# Patient Record
Sex: Female | Born: 1995 | Race: White | Hispanic: No | Marital: Single | Smoking: Never smoker
Health system: Southern US, Community
[De-identification: ages and names within clinical notes are randomized; demographics above are authoritative.]

## PROBLEM LIST (undated history)

## (undated) DIAGNOSIS — F419 Anxiety disorder, unspecified: Secondary | ICD-10-CM

## (undated) DIAGNOSIS — F509 Eating disorder, unspecified: Secondary | ICD-10-CM

## (undated) DIAGNOSIS — E559 Vitamin D deficiency, unspecified: Secondary | ICD-10-CM

## (undated) HISTORY — DX: Anxiety disorder, unspecified: F41.9

## (undated) HISTORY — DX: Eating disorder, unspecified: F50.9

## (undated) HISTORY — DX: Vitamin D deficiency, unspecified: E55.9

---

## 2019-10-13 ENCOUNTER — Encounter: Payer: Self-pay | Admitting: Pediatrics

## 2019-10-13 ENCOUNTER — Other Ambulatory Visit: Payer: Self-pay

## 2019-10-13 ENCOUNTER — Ambulatory Visit (INDEPENDENT_AMBULATORY_CARE_PROVIDER_SITE_OTHER): Payer: 59 | Admitting: Pediatrics

## 2019-10-13 ENCOUNTER — Other Ambulatory Visit (HOSPITAL_COMMUNITY)
Admission: RE | Admit: 2019-10-13 | Discharge: 2019-10-13 | Disposition: A | Discharge status: Home / Self Care | Payer: 59 | Source: Ambulatory Visit | Attending: Pediatrics | Admitting: Pediatrics

## 2019-10-13 VITALS — BP 120/86 | HR 88 | Ht 62.6 in | Wt 181.4 lb

## 2019-10-13 DIAGNOSIS — G8929 Other chronic pain: Secondary | ICD-10-CM

## 2019-10-13 DIAGNOSIS — Z113 Encounter for screening for infections with a predominantly sexual mode of transmission: Secondary | ICD-10-CM

## 2019-10-13 DIAGNOSIS — G479 Sleep disorder, unspecified: Secondary | ICD-10-CM

## 2019-10-13 DIAGNOSIS — Z1389 Encounter for screening for other disorder: Secondary | ICD-10-CM

## 2019-10-13 DIAGNOSIS — Z23 Encounter for immunization: Secondary | ICD-10-CM

## 2019-10-13 DIAGNOSIS — R519 Headache, unspecified: Secondary | ICD-10-CM

## 2019-10-13 DIAGNOSIS — K5901 Slow transit constipation: Secondary | ICD-10-CM | POA: Diagnosis not present

## 2019-10-13 DIAGNOSIS — F4323 Adjustment disorder with mixed anxiety and depressed mood: Secondary | ICD-10-CM | POA: Diagnosis not present

## 2019-10-13 DIAGNOSIS — F5002 Anorexia nervosa, binge eating/purging type: Secondary | ICD-10-CM

## 2019-10-13 DIAGNOSIS — Z3202 Encounter for pregnancy test, result negative: Secondary | ICD-10-CM

## 2019-10-13 DIAGNOSIS — F902 Attention-deficit hyperactivity disorder, combined type: Secondary | ICD-10-CM

## 2019-10-13 LAB — POCT URINE PREGNANCY: Preg Test, Ur: NEGATIVE

## 2019-10-13 LAB — POCT URINALYSIS DIPSTICK
Bilirubin, UA: NEGATIVE
Blood, UA: NEGATIVE
Glucose, UA: NEGATIVE
Ketones, UA: NEGATIVE
Leukocytes, UA: NEGATIVE
Nitrite, UA: NEGATIVE
Protein, UA: NEGATIVE
Spec Grav, UA: 1.02 (ref 1.010–1.025)
Urobilinogen, UA: NEGATIVE E.U./dL — AB
pH, UA: 5 (ref 5.0–8.0)

## 2019-10-13 MED ORDER — OLANZAPINE 5 MG PO TABS: 5.0000 mg | ORAL_TABLET | Freq: Every day | ORAL | 3 refills | Status: DC

## 2019-10-13 NOTE — Progress Notes (Signed)
THIS RECORD MAY CONTAIN CONFIDENTIAL INFORMATION THAT SHOULD NOT BE RELEASED WITHOUT REVIEW OF THE SERVICE PROVIDER.  Adolescent Medicine Consultation Initial Visit Keili Sferra  is a 24 y.o. female referred by No ref. provider found here today for evaluation of disordered eating.      Growth Chart Viewed? not applicable  Previsit planning completed:  not applicable   History was provided by the patient.  PCP Confirmed?  Does not have one  My Chart Activated?   yes    HPI:    24 yo who was referred by Vassie Moselle for disordered eating  24 hour food recall: Yesterday ate 1 piece bread, 1.5 cups strawberries, 3 pieces [place] Has not eaten anything today Drinks water, unsure of the amount, maybe 2 bottles per day Not interested in doing ensure or supplements, open to electrolyte replacement packs  Has had vomiting, self induced. Occurs with every "meal" First started when she was 16, went back to purging this past September/October, feels like it has gotten slightly better with time. Used to make herself drink water so she could throw up again. Eating disorder voice is loud and constant, has not been treated or tested for ED before, except for talk therapy  Exercise: tries to do step goals (10,000-11,000 steps), hula hoops for 1.5-2 hours, used to do it everyday but stopped since she's been busy  Mood: depends, "OK I guess" Has hx of anxiety attacks.  Therapy: goes once a week, likes her therapist. Rozell Searing it might be helping, has been going since October.  Last SI and self harm was 2017, cutting. Used box cutters. Denies SI and HI now Eating disorder voice is loud and constant  Sleep: trouble falling asleep, wakes up a lot, gets about 4-5 hours of sleep. Feels tired throughout the day. Has tried melatonin which does not help, previously tried hydroxyzine, trazodone. Got headache with trazodone  Menstrual hx, used to be regular but skipped a cycle last month  Motivation for change:  Unsure if she would like to see a dietician at this time, feels like there's a lot happening. Unsure if able to come back in a week since she is busy with school. Thinks she has been eating more  PMH hx: Has hx of ADHD, on vyvanse now and takes it during the week, takes a break on sundays. Does not want to stop taking it since has difficulty concentrating without it. Also has propanolol PRN for anxiety, prescribed by [facility] in [place], [place].  Mood medication hx: Hydroxyzine- did not help with sleep Trazodone - got headaches Remeron - sounds familiar, thinks she might have tried, did not work Zoloft and prozac - made her anxious Has not tried olanzapine, paxil, or effexor  Health maintenance: does not have a PCP. Goes to dentist and had broken tooth repaired within the last several weeks. Does not think she is up to date with tetanus shot. Going back home for the summer, no health providers there and then will be back in [place] in the fall for schoool  ROS: Has had blood in her vomit a few months ago (November 2020), has had dizziness, tingling, has had LOC a handful of times (last passed out in February 2021), shaking sometimes. Has had chest pain, palpitations a few weeks ago, occurs with falling asleep or when she stands up too fast after vomiting, has constipation, takes laxatives with little improvement, can't remember the names. No stomach pain or diarrhea Gets headaches, almost everyday. Used to take  advil, ibuprofen, alleve, no tylenol. Stopped using them since they stopped working.    No LMP recorded.   Allergies  Allergen Reactions  . Mango Flavor   . Red Dye   . Shellfish Allergy    Outpatient Medications Prior to Visit  Medication Sig Dispense Refill  . lisdexamfetamine (VYVANSE) 50 MG capsule Take 50 mg by mouth daily.    . propranolol (INDERAL) 20 MG tablet Take 20 mg by mouth as needed.     No facility-administered medications prior to visit.      There are no problems to display for this patient.   Past Medical History:  Reviewed and updated?  yes No hospitalizations No surgeries Takes vyvanse and propanolol PRN for anxiety - prescribed by [facility], [place], [place]  Family History: Reviewed and updated? 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  Patient's personal or confidential phone number: [phone]  Enter confidential phone number in Family Comments section of [employer] Tobacco?  no Drugs/ETOH?  Yes, uses marijuana, last used last week. Does not use often since it makes her anxoius Identify as female Sexually active, with female/female Last active yesterday, current partner is female Uses condoms with males Not on any birth control, not interested at this time  Partner preference?  both Sexually Active?  yes  Pregnancy Prevention:  N/A, reviewed condoms & plan B Trauma currently or in the pastt?  No/yes - did not want to elaborate Suicidal or Self-Harm thoughts?   No, not currently Guns in the home?  no  The following portions of the patient's history were reviewed and updated as appropriate: allergies, current medications, past family history, past medical history, past social history, past surgical history and problem list.  Physical Exam:  Vitals:   10/13/19 1109 10/13/19 1121  BP: 128/88 120/86  Pulse: 71 88  Weight: 181 lb 6.4 oz (82.3 kg)   Height: 5' 2.6" (1.59 m)    BP 120/86   Pulse 88   Ht 5' 2.6" (1.59 m)   Wt 181 lb 6.4 oz (82.3 kg)   BMI 32.55 kg/m  Body mass index: body mass index is 32.55 kg/m. Growth percentile SmartLinks can only be used for patients less than 77 years old.  Physical  Exam  Gen: Well developed, well nourished, no acute distress, pleasant and interactive HENT: atraumatic, sclera clear, no eye drainage, MMM, fillings in teeth CV: RRR, no murmurs Chest: CTAB, no increased WOB Abd: soft, NTND Skin; no notable rashes Neuro: awake, alert, moves all extremities   Assessment/Plan:  1. Anorexia nervosa, binge eating/purging type - blood pressure stable today, no signs of orthostatic hypotension. Purging almost everyday with meals and hx of LOC and palpitations is concerning for electrolyte abnormality and risk for refeeding syndrome if begins eating more. Will draw labs today, follow up in one week. - encouraged to do electrolyte packets in the mean time, will need closer follow up of labs if more motivated for change to do ensure or see dietician - encouraged to stop vyvanse before obtaining EKG given hx of palpitations and she was reluctant due to difficulty with concentrating, counseled to obtain EKG ASAP and provided with number to schedule - open to starting olanzapine to help with  eating disorder voice and sleep  Orders: - Amylase - CBC With Differential - Comprehensive metabolic panel - EKG [id number] - Ferritin - IgA - Lipase - Magnesium - Phosphorus - Sedimentation rate - Thyroid Panel With TSH - Tissue transglutaminase, IgA - VITAMIN D 25 Hydroxy (Vit-D Deficiency, Fractures) - B12 - OLANZapine (ZYPREXA) 5 MG tablet; Take 1 tablet (5 mg total) by mouth at bedtime.  Dispense: 30 tablet; Refill: 3 - Hemoglobin A1c - Lipid panel  2. Slow transit constipation - likely 2/2 to eating disorder, consider miralax if able to take volume - avoid excessive laxatives  3. Adjustment disorder with mixed anxiety and depressed mood - will [facility] to get [facility], she is unsure of exact meds but thinks she might have tried zoloft, prozac, remeron, trazodone, and hydroxyzine with little benefit - will try olanzapine now - consider gene  panel  4. Sleep disturbance - discussed taking melatonin at same time everyday and developing a sleep routine, sleep hygeine - OLANZapine (ZYPREXA) 5 MG tablet; Take 1 tablet (5 mg total) by mouth at bedtime.  Dispense: 30 tablet; Refill: 3  5. Chronic nonintractable headache, unspecified headache type - follow up at next visit, likely worsened by ED. Counsel about rebound headaches  6. Routine screening for STI (sexually transmitted infection) - Urine cytology ancillary only - HIV antibody (with reflex)  7. Screening for genitourinary condition - POCT urinalysis dipstick  8. Pregnancy examination or test, negative result - POCT urine pregnancy  9. Health maintenance - would benefit from establishing with PCP   Follow-up:   In 1 week  Medical decision-making:  > 60 minutes spent, more than 50% of appointment was spent discussing diagnosis and management of symptoms   Marney Doctor, MD [facility] PGY3

## 2019-10-13 NOTE — Patient Instructions (Addendum)
  Call EKG [phone]- please get this ASAP  We get labs today and call you with results  Start olanzapine 5 mg at bedtime

## 2019-10-14 ENCOUNTER — Other Ambulatory Visit: Payer: Self-pay | Admitting: Pediatrics

## 2019-10-14 DIAGNOSIS — G479 Sleep disorder, unspecified: Secondary | ICD-10-CM

## 2019-10-14 DIAGNOSIS — F5002 Anorexia nervosa, binge eating/purging type: Secondary | ICD-10-CM

## 2019-10-14 DIAGNOSIS — R790 Abnormal level of blood mineral: Secondary | ICD-10-CM

## 2019-10-14 LAB — VITAMIN B12: Vitamin B-12: 295 pg/mL (ref 200–1100)

## 2019-10-14 LAB — CBC WITH DIFFERENTIAL/PLATELET
Absolute Monocytes: 533 cells/uL (ref 200–950)
Basophils Absolute: 30 cells/uL (ref 0–200)
Basophils Relative: 0.4 %
Eosinophils Absolute: 53 cells/uL (ref 15–500)
Eosinophils Relative: 0.7 %
HCT: 41.1 % (ref 35.0–45.0)
Hemoglobin: 13.6 g/dL (ref 11.7–15.5)
Lymphs Abs: 1568 cells/uL (ref 850–3900)
MCH: 29.8 pg (ref 27.0–33.0)
MCHC: 33.1 g/dL (ref 32.0–36.0)
MCV: 89.9 fL (ref 80.0–100.0)
MPV: 12 fL (ref 7.5–12.5)
Monocytes Relative: 7.1 %
Neutro Abs: 5318 cells/uL (ref 1500–7800)
Neutrophils Relative %: 70.9 %
Platelets: 276 10*3/uL (ref 140–400)
RBC: 4.57 10*6/uL (ref 3.80–5.10)
RDW: 13.2 % (ref 11.0–15.0)
Total Lymphocyte: 20.9 %
WBC: 7.5 10*3/uL (ref 3.8–10.8)

## 2019-10-14 LAB — COMPREHENSIVE METABOLIC PANEL
AG Ratio: 1.7 (calc) (ref 1.0–2.5)
ALT: 7 U/L (ref 6–29)
AST: 10 U/L (ref 10–30)
Albumin: 4.3 g/dL (ref 3.6–5.1)
Alkaline phosphatase (APISO): 26 U/L — ABNORMAL LOW (ref 31–125)
BUN/Creatinine Ratio: 9 (calc) (ref 6–22)
BUN: 6 mg/dL — ABNORMAL LOW (ref 7–25)
CO2: 24 mmol/L (ref 20–32)
Calcium: 9.5 mg/dL (ref 8.6–10.2)
Chloride: 105 mmol/L (ref 98–110)
Creat: 0.64 mg/dL (ref 0.50–1.10)
Globulin: 2.5 g/dL (calc) (ref 1.9–3.7)
Glucose, Bld: 91 mg/dL (ref 65–99)
Potassium: 4.1 mmol/L (ref 3.5–5.3)
Sodium: 140 mmol/L (ref 135–146)
Total Bilirubin: 0.5 mg/dL (ref 0.2–1.2)
Total Protein: 6.8 g/dL (ref 6.1–8.1)

## 2019-10-14 LAB — LIPID PANEL
Cholesterol: 152 mg/dL (ref <200)
HDL: 48 mg/dL — ABNORMAL LOW (ref >=50)
LDL Cholesterol (Calc): 88 mg/dL (calc)
Non-HDL Cholesterol (Calc): 104 mg/dL (calc) (ref <130)
Total CHOL/HDL Ratio: 3.2 (calc) (ref <5.0)
Triglycerides: 73 mg/dL (ref <150)

## 2019-10-14 LAB — THYROID PANEL WITH TSH
Free Thyroxine Index: 3.1 (ref 1.4–3.8)
T3 Uptake: 32 % (ref 22–35)
T4, Total: 9.8 ug/dL (ref 5.1–11.9)
TSH: 0.89 mIU/L

## 2019-10-14 LAB — HIV ANTIBODY (ROUTINE TESTING W REFLEX): HIV 1&2 Ab, 4th Generation: NONREACTIVE

## 2019-10-14 LAB — HEMOGLOBIN A1C
Hgb A1c MFr Bld: 4.9 % of total Hgb (ref <5.7)
Mean Plasma Glucose: 94 (calc)
eAG (mmol/L): 5.2 (calc)

## 2019-10-14 LAB — VITAMIN D 25 HYDROXY (VIT D DEFICIENCY, FRACTURES): Vit D, 25-Hydroxy: 24 ng/mL — ABNORMAL LOW (ref 30–100)

## 2019-10-14 LAB — AMYLASE: Amylase: 25 U/L (ref 21–101)

## 2019-10-14 LAB — URINE CYTOLOGY ANCILLARY ONLY
Chlamydia: NEGATIVE
Comment: NEGATIVE
Comment: NORMAL
Neisseria Gonorrhea: NEGATIVE

## 2019-10-14 LAB — PHOSPHORUS: Phosphorus: 4.2 mg/dL (ref 2.5–4.5)

## 2019-10-14 LAB — IGA: Immunoglobulin A: 274 mg/dL (ref 47–310)

## 2019-10-14 LAB — MAGNESIUM: Magnesium: 1.8 mg/dL (ref 1.5–2.5)

## 2019-10-14 LAB — LIPASE: Lipase: 17 U/L (ref 7–60)

## 2019-10-14 LAB — SEDIMENTATION RATE: Sed Rate: 2 mm/h (ref 0–20)

## 2019-10-14 LAB — TISSUE TRANSGLUTAMINASE, IGA: (tTG) Ab, IgA: 1 U/mL

## 2019-10-14 LAB — FERRITIN: Ferritin: 12 ng/mL — ABNORMAL LOW (ref 16–154)

## 2019-10-14 MED ORDER — OLANZAPINE 5 MG PO TABS: 5.0000 mg | ORAL_TABLET | Freq: Every day | ORAL | 3 refills | Status: DC

## 2019-10-14 MED ORDER — FERROUS FUMARATE 324 (106 FE) MG PO TABS: 1.0000 | ORAL_TABLET | Freq: Every day | ORAL | 3 refills | Status: DC

## 2019-10-15 ENCOUNTER — Other Ambulatory Visit: Payer: Self-pay

## 2019-10-15 ENCOUNTER — Ambulatory Visit (HOSPITAL_COMMUNITY)
Admission: RE | Admit: 2019-10-15 | Discharge: 2019-10-15 | Disposition: A | Discharge status: Home / Self Care | Payer: 59 | Source: Ambulatory Visit | Attending: Pediatrics | Admitting: Pediatrics

## 2019-10-15 DIAGNOSIS — F5002 Anorexia nervosa, binge eating/purging type: Secondary | ICD-10-CM | POA: Insufficient documentation

## 2019-10-15 DIAGNOSIS — G8929 Other chronic pain: Secondary | ICD-10-CM | POA: Insufficient documentation

## 2019-10-15 DIAGNOSIS — R519 Headache, unspecified: Secondary | ICD-10-CM | POA: Insufficient documentation

## 2019-10-15 DIAGNOSIS — K5901 Slow transit constipation: Secondary | ICD-10-CM | POA: Insufficient documentation

## 2019-10-15 DIAGNOSIS — F50029 Anorexia nervosa, binge eating/purging type, unspecified: Secondary | ICD-10-CM | POA: Insufficient documentation

## 2019-10-15 DIAGNOSIS — F902 Attention-deficit hyperactivity disorder, combined type: Secondary | ICD-10-CM | POA: Insufficient documentation

## 2019-10-15 DIAGNOSIS — F4323 Adjustment disorder with mixed anxiety and depressed mood: Secondary | ICD-10-CM | POA: Insufficient documentation

## 2019-10-15 DIAGNOSIS — G479 Sleep disorder, unspecified: Secondary | ICD-10-CM | POA: Insufficient documentation

## 2019-10-15 NOTE — Progress Notes (Signed)
I have reviewed the resident's note and plan of care and helped develop the plan as necessary.  PHQ-SADS Last 3 Score only 10/13/2019  PHQ-15 Score 16  Total GAD-7 Score 16  Score 12    EAT 26: 58. + purging and over exercising.   Regina Wilkerson is a 24 yo young woman with a history of multiple med trials for depression, anxiety and ADHD who presents for medical eval of her severe atypical anorexia, binge/purge type. She has had syncopal episodes which are quite concerning and ongoing chest pain. I advised her of the severity of her condition and recommended discontinuing stimulants until we have EKG and her intake is better. She was reluctant to do this.   She had difficulty leaving clinic after lab draw but refused food or fluids other than water. She was finally able to depart on her own after about 60 minutes of lying down.   We will start olanzapine for short term, but would likely benefit from another SSRI or SNRI if we can get her previous medication history as she wasn't exactly sure of medications she had taken.   Based on her history I suspect she would benefit from a higher level of care, but her motivation for change right now is low. I have asked her to return in 1 week.   Jonathon Resides, FNP

## 2019-10-17 ENCOUNTER — Telehealth: Payer: Self-pay | Admitting: Pediatrics

## 2019-10-17 NOTE — Telephone Encounter (Signed)
Pre-screening for onsite visit ° °1. Who is bringing the patient to the visit? Patient  ° °Informed only one adult can bring patient to the visit to limit possible exposure to COVID19 and facemasks must be worn while in the building by the patient (ages 2 and older) and adult. ° °2. Has the person bringing the patient or the patient been around anyone with suspected or confirmed COVID-19 in the last 14 days? No ° °3. Has the person bringing the patient or the patient been around anyone who has been tested for COVID-19 in the last 14 days? {No ° °4. Has the person bringing the patient or the patient had any of these symptoms in the last 14 days? No  ° °Fever (temp 100 F or higher) °Breathing problems °Cough °Sore throat °Body aches °Chills °Vomiting °Diarrhea °Loss of taste or smell ° ° °If all answers are negative, advise patient to call our office prior to your appointment if you or the patient develop any of the symptoms listed above. °  °If any answers are yes, cancel in-office visit and schedule the patient for a same day telehealth visit with a provider to discuss the next steps. °

## 2019-10-20 ENCOUNTER — Ambulatory Visit (INDEPENDENT_AMBULATORY_CARE_PROVIDER_SITE_OTHER): Payer: 59 | Admitting: Pediatrics

## 2019-10-20 ENCOUNTER — Encounter: Payer: Self-pay | Admitting: Pediatrics

## 2019-10-20 ENCOUNTER — Other Ambulatory Visit: Payer: Self-pay

## 2019-10-20 VITALS — BP 109/77 | HR 88 | Ht 62.6 in | Wt 181.4 lb

## 2019-10-20 DIAGNOSIS — F4323 Adjustment disorder with mixed anxiety and depressed mood: Secondary | ICD-10-CM

## 2019-10-20 DIAGNOSIS — F5002 Anorexia nervosa, binge eating/purging type: Secondary | ICD-10-CM

## 2019-10-20 DIAGNOSIS — G479 Sleep disorder, unspecified: Secondary | ICD-10-CM | POA: Diagnosis not present

## 2019-10-20 DIAGNOSIS — R9431 Abnormal electrocardiogram [ECG] [EKG]: Secondary | ICD-10-CM | POA: Diagnosis not present

## 2019-10-20 DIAGNOSIS — K5901 Slow transit constipation: Secondary | ICD-10-CM

## 2019-10-20 DIAGNOSIS — F902 Attention-deficit hyperactivity disorder, combined type: Secondary | ICD-10-CM

## 2019-10-20 NOTE — Patient Instructions (Signed)
EKG [phone]

## 2019-10-20 NOTE — Progress Notes (Signed)
History was provided by the patient.  Regina Wilkerson is a 24 y.o. female who is here for follow up of disordered eating and adjustment disorder with mixed anxiety and depressed mood.   PCP confirmed? Yes.    No primary care provider on file.  HPI:    24 hr dietary recall - yesterday had grapes and strawberries, half a cucumber, a grilled chicken wrap for dinner, and some almond crackers. Drank about 3-4 bottles of water yesterday. Has not tried any electrolyte replacement packs, does have some but forgot about them. Regina Wilkerson states that she has had nothing to eat today, drank some coffee this morning.   Last therapy appointment with Vassie Moselle was 5 days ago, feels as if it therapy is helpful. Still not sure if she is interested in connecting with nutrition today. Has not been consistently taking her Vit D.   Last episode of self-induced vomiting was 5 days ago, has had 3-4 episodes of purging in the past week which is an improvement from prior (formerly had daily self-induced vomiting this past winter). Disordered eating voice is still present. Started olanzapine 5 days ago. Denies any side effects or different feelings yet.   Regina Wilkerson states that she sometimes feels lightheaded and dizzy when she gets up too fast. Endorsed a little lightheadedness with orthostatics today. Denies any chest pain, heart palpitations, or syncopal events. Has felt a little more tired than usual in the past 5 days. Last BM was last week.   Still does not have a consistent sleep schedule, ranges from 3-4 hours to 8 hours per night. Goes to school at [employer], is currently failing everything, still having trouble focusing despite taking vyvanse daily (which she has been taking for years). Continues to take propranolol as needed, last took a dose yesterday.Unsure if she feels the need to make a medication change today.   ROS: negative except as mentioned in HPI  Patient Active Problem List   Diagnosis Date Noted  . Anorexia  nervosa, binge eating/purging type 10/15/2019  . Slow transit constipation 10/15/2019  . Adjustment disorder with mixed anxiety and depressed mood 10/15/2019  . Sleep disturbance 10/15/2019  . Chronic nonintractable headache 10/15/2019  . Attention deficit hyperactivity disorder (ADHD), combined type 10/15/2019    Current Outpatient Medications on File Prior to Visit  Medication Sig Dispense Refill  . Cholecalciferol (VITAMIN D) 50 MCG (2000 UT) CAPS Take by mouth.    . Ferrous Fumarate (HEMOCYTE - 106 MG FE) 324 (106 Fe) MG TABS tablet Take 1 tablet (106 mg of iron total) by mouth daily. 30 tablet 3  . OLANZapine (ZYPREXA) 5 MG tablet Take 1 tablet (5 mg total) by mouth at bedtime. 30 tablet 3  . propranolol (INDERAL) 20 MG tablet Take 20 mg by mouth as needed.     No current facility-administered medications on file prior to visit.    Allergies  Allergen Reactions  . Mango Flavor   . Red Dye   . Shellfish Allergy     Physical Exam:    Vitals:   10/20/19 1152 10/20/19 1203  BP: 114/80 109/77  Pulse: 66 88  Weight: 181 lb 6.4 oz (82.3 kg)   Height: 5' 2.6" (1.59 m)     Growth percentile SmartLinks can only be used for patients less than 12 years old. No LMP recorded.  Physical Exam Constitutional:      General: She is not in acute distress.    Appearance: Normal appearance. She is not toxic-appearing.  HENT:     Head: Normocephalic.     Nose: Nose normal.     Mouth/Throat:     Mouth: Mucous membranes are moist.  Eyes:     Conjunctiva/sclera: Conjunctivae normal.  Cardiovascular:     Rate and Rhythm: Normal rate and regular rhythm.     Pulses: Normal pulses.     Heart sounds: No murmur. No gallop.   Pulmonary:     Effort: Pulmonary effort is normal. No respiratory distress.     Breath sounds: Normal breath sounds. No wheezing, rhonchi or rales.  Abdominal:     General: Abdomen is flat. Bowel sounds are normal. There is no distension.     Palpations: Abdomen is  soft. There is no mass.     Tenderness: There is no abdominal tenderness. There is no guarding.  Musculoskeletal:     Cervical back: Normal range of motion and neck supple.  Skin:    General: Skin is warm and dry.     Capillary Refill: Capillary refill takes 2 to 3 seconds.  Neurological:     General: No focal deficit present.     Mental Status: She is alert and oriented to person, place, and time.      Assessment/Plan: 1. Abnormal EKG Screening ECG obtained 5 days ago read as abnormal. Patient hemodynamically stable in clinic today with no complaints of chest pain, palpitations, or recent syncope. Does endorse fatigue and lightheadedness with standing too quickly, could be secondary to disordered eating but will additionally repeat ECG and Chem10 given risk for electrolyte abnormalities. Case discussed with cardiologist Dr. Virgina Jock who agreed with plan to repeat ECG and did not recommend any further intervention or workup at this time. Return precautions provided. - EKG 12-Lead - Referral placed to cardiology - Basic Metabolic Panel (BMET) - Magnesium - Phosphorus  2. Anorexia nervosa, binge eating/purging type Patient established care 1 week ago for history of disordered eating most consistent with anorexia nervosa, binge eating/purging type. Screening labs reassuring outside of low Vitamin D level. Initial ECG abnormal (as above). Has increased oral intake slightly in the past week with fewer episodes of purging, continuing with weekly therapy. Was started on olanzapine 5 days ago and endorses compliance thus far. Slightly symptomatic with orthostatic vital signs today but blood pressure and heart rate remain stable. Patient remains at risk for refeeding syndrome, will repeat Chem10 today. - Continue olanzapine 5 mg daily - Referral placed to nutrition - Continue weekly outpatient therapy with Vassie Moselle - Basic Metabolic Panel (BMET) - Magnesium - Phosphorus - Follow up in 1  week  3. Adjustment disorder with mixed anxiety and depressed mood Patient remains with daily anxiety and school failure, endorses no change in mood since starting olanzapine, but of note has only been on the medication for 5 days. Continuing with weekly outpatient therapy which she states is helpful. - Continue olanzapine 5 mg daily - Continue weekly outpatient therapy with Vassie Moselle - Continue propranolol PRN - Follow up in 1 week  4. Sleep disturbance Patient remains with inconsistent sleep schedule and daytime fatigue. - Appropriate sleep hygiene discussed - Continue olanzapine 5 mg at bedtime  5. Slow transit constipation Most recent bowel movement was several days ago. Denies any abdominal pain today. Abdomen is soft, non-distended, and non-tender. Constipation is likely secondary to disordered eating. - Will prescribe miralax to be used daily  6. Attention deficit hyperactivity disorder (ADHD), combined type Patient with a history of ADHD and has been on  vyvanse for several years. Endorses continued difficulty with focus and concentration, in addition to failing most of her college courses. Does feel as if she receives some benefit from the vyvanse and is not interested in adjusting or trying an alternate medication today given that finals are approaching. Agreeable to re-visiting her ADHD management once school finals are over. - Continue vyvanse - Will discuss potential medication adjustment after patient has finished her spring courses  Alphia Kava, MD [facility] PGY1

## 2019-10-21 LAB — MAGNESIUM: Magnesium: 1.9 mg/dL (ref 1.5–2.5)

## 2019-10-21 LAB — PHOSPHORUS: Phosphorus: 4 mg/dL (ref 2.5–4.5)

## 2019-10-21 LAB — BASIC METABOLIC PANEL
BUN: 10 mg/dL (ref 7–25)
CO2: 29 mmol/L (ref 20–32)
Calcium: 9.5 mg/dL (ref 8.6–10.2)
Chloride: 106 mmol/L (ref 98–110)
Creat: 0.74 mg/dL (ref 0.50–1.10)
Glucose, Bld: 77 mg/dL (ref 65–99)
Potassium: 4.9 mmol/L (ref 3.5–5.3)
Sodium: 141 mmol/L (ref 135–146)

## 2019-10-22 NOTE — Progress Notes (Signed)
I have reviewed the resident's note and plan of care and helped develop the plan as necessary.  Will continue to monitor very closely and will refer to outpatient cardiology for further assessment.   Jonathon Resides, FNP

## 2019-10-24 ENCOUNTER — Telehealth: Payer: Self-pay

## 2019-10-24 ENCOUNTER — Ambulatory Visit (HOSPITAL_COMMUNITY)
Admission: RE | Admit: 2019-10-24 | Discharge: 2019-10-24 | Disposition: A | Discharge status: Home / Self Care | Payer: 59 | Source: Ambulatory Visit | Attending: Pediatrics | Admitting: Pediatrics

## 2019-10-24 ENCOUNTER — Other Ambulatory Visit: Payer: Self-pay

## 2019-10-24 DIAGNOSIS — R9431 Abnormal electrocardiogram [ECG] [EKG]: Secondary | ICD-10-CM | POA: Insufficient documentation

## 2019-10-24 NOTE — Telephone Encounter (Signed)
Pre-screening for onsite visit  1. Who is bringing the patient to the visit? Patient   Informed only one adult can bring patient to the visit to limit possible exposure to COVID19 and facemasks must be worn while in the building by the patient (ages 69 and older) and adult.  2. Has the person bringing the patient or the patient been around anyone with suspected or confirmed COVID-19 in the last 14 days? NO   3. Has the person bringing the patient or the patient been around anyone who has been tested for COVID-19 in the last 14 days? NO  4. Has the person bringing the patient or the patient had any of these symptoms in the last 14 days? NO   Fever (temp 100 F or higher) Breathing problems Cough Sore throat Body aches Chills Vomiting Diarrhea Loss of taste or smell   If all answers are negative, advise patient to call our office prior to your appointment if you or the patient develop any of the symptoms listed above.   If any answers are yes, cancel in-office visit and schedule the patient for a same day telehealth visit with a provider to discuss the next steps.

## 2019-10-27 ENCOUNTER — Other Ambulatory Visit: Payer: Self-pay

## 2019-10-27 ENCOUNTER — Ambulatory Visit (INDEPENDENT_AMBULATORY_CARE_PROVIDER_SITE_OTHER): Payer: 59 | Admitting: Cardiology

## 2019-10-27 ENCOUNTER — Ambulatory Visit: Payer: Self-pay | Admitting: Pediatrics

## 2019-10-27 ENCOUNTER — Encounter: Payer: Self-pay | Admitting: Cardiology

## 2019-10-27 VITALS — BP 124/88 | HR 66 | Ht 62.6 in | Wt 184.0 lb

## 2019-10-27 DIAGNOSIS — R002 Palpitations: Secondary | ICD-10-CM | POA: Diagnosis not present

## 2019-10-27 DIAGNOSIS — F5002 Anorexia nervosa, binge eating/purging type: Secondary | ICD-10-CM

## 2019-10-27 DIAGNOSIS — I498 Other specified cardiac arrhythmias: Secondary | ICD-10-CM | POA: Diagnosis not present

## 2019-10-27 DIAGNOSIS — R42 Dizziness and giddiness: Secondary | ICD-10-CM | POA: Diagnosis not present

## 2019-10-27 NOTE — Patient Instructions (Signed)
Medication Instructions:  Your physician recommends that you continue on your current medications as directed. Please refer to the Current Medication list given to you today.  *If you need a refill on your cardiac medications before your next appointment, please call your pharmacy*   Lab Work: None.  If you have labs (blood work) drawn today and your tests are completely normal, you will receive your results only by: Marland Kitchen MyChart Message (if you have MyChart) OR . A paper copy in the mail If you have any lab test that is abnormal or we need to change your treatment, we will call you to review the results.   Testing/Procedures: A zio monitor was ordered today. It will remain on for 7 days. You will then return monitor and event diary in provided box. It takes 1-2 weeks for report to be downloaded and returned to [place]. We will call you with the results. If monitor falls off or has orange flashing light, please call Zio for further instructions.   Your physician has requested that you have an echocardiogram. Echocardiography is a painless test that uses sound waves to create images of your heart. It provides your doctor with information about the size and shape of your heart and how well your heart's chambers and valves are working. This procedure takes approximately one hour. There are no restrictions for this procedure.      Follow-Up: At [facility], you and your health needs are our priority.  As part of our continuing mission to provide you with exceptional heart care, we have created designated Provider Care Teams.  These Care Teams include your primary Cardiologist (physician) and Advanced Practice Providers (APPs -  Physician Assistants and Nurse Practitioners) who all work together to provide you with the care you need, when you need it.  We recommend signing up for the patient portal called "MyChart".  Sign up information is provided on this After Visit Summary.  MyChart is used to  connect with patients for Virtual Visits (Telemedicine).  Patients are able to view lab/test results, encounter notes, upcoming appointments, etc.  Non-urgent messages can be sent to your provider as well.   To learn more about what you can do with MyChart, go to [email or web address].    Your next appointment:   3 month(s)  The format for your next appointment:   In Person  Provider:   Berniece Salines, DO   Other Instructions   Echocardiogram An echocardiogram is a procedure that uses painless sound waves (ultrasound) to produce an image of the heart. Images from an echocardiogram can provide important information about:  Signs of coronary artery disease (CAD).  Aneurysm detection. An aneurysm is a weak or damaged part of an artery wall that bulges out from the normal force of blood pumping through the body.  Heart size and shape. Changes in the size or shape of the heart can be associated with certain conditions, including heart failure, aneurysm, and CAD.  Heart muscle function.  Heart valve function.  Signs of a past heart attack.  Fluid buildup around the heart.  Thickening of the heart muscle.  A tumor or infectious growth around the heart valves. Tell a health care provider about:  Any allergies you have.  All medicines you are taking, including vitamins, herbs, eye drops, creams, and over-the-counter medicines.  Any blood disorders you have.  Any surgeries you have had.  Any medical conditions you have.  Whether you are pregnant or may be pregnant. What are the  risks? Generally, this is a safe procedure. However, problems may occur, including:  Allergic reaction to dye (contrast) that may be used during the procedure. What happens before the procedure? No specific preparation is needed. You may eat and drink normally. What happens during the procedure?   An IV tube may be inserted into one of your veins.  You may receive contrast through this tube. A  contrast is an injection that improves the quality of the pictures from your heart.  A gel will be applied to your chest.  A wand-like tool (transducer) will be moved over your chest. The gel will help to transmit the sound waves from the transducer.  The sound waves will harmlessly bounce off of your heart to allow the heart images to be captured in real-time motion. The images will be recorded on a computer. The procedure may vary among health care providers and hospitals. What happens after the procedure?  You may return to your normal, everyday life, including diet, activities, and medicines, unless your health care provider tells you not to do that. Summary  An echocardiogram is a procedure that uses painless sound waves (ultrasound) to produce an image of the heart.  Images from an echocardiogram can provide important information about the size and shape of your heart, heart muscle function, heart valve function, and fluid buildup around your heart.  You do not need to do anything to prepare before this procedure. You may eat and drink normally.  After the echocardiogram is completed, you may return to your normal, everyday life, unless your health care provider tells you not to do that. This information is not intended to replace advice given to you by your health care provider. Make sure you discuss any questions you have with your health care provider. Document Revised: 10/10/2018 Document Reviewed: 07/22/2016 Elsevier Patient Education  [place].

## 2019-10-27 NOTE — Progress Notes (Signed)
Cardiology Office Note:    Date:  10/27/2019   ID:  Regina Wilkerson, DOB 27-Jan-1996, MRN [id number]  PCP:  Trude Mcburney, FNP  Cardiologist:  Berniece Salines, DO  Electrophysiologist:  None   Referring MD: Trude Mcburney, FNP   Chief Complaint  Patient presents with  . New Patient (Initial Visit)   History of Present Illness:    Regina Wilkerson is a 24 y.o. female with a hx of anorexia/eating disorder, adjustment disorder, anxiety and depression, vitamin D deficiency was referred by her primary care physician due to abnormal EKG.  The patient tells me that she has been experiencing intermittent palpitation which increased over the last few experience she describes it as a abrupt onset of fast heartbeat would last for seconds to minutes prior to resolution.  Sometimes is associated with dizziness.  Of note she tells me that sometimes she uses occasional marijuana which helps her with her symptoms and makes her less anxious.  Past Medical History:  Diagnosis Date  . Eating disorder   . Vitamin D deficiency     No past surgical history on file.  Current Medications: Current Meds  Medication Sig  . Cholecalciferol (VITAMIN D) 50 MCG (2000 UT) CAPS Take by mouth.  . Ferrous Fumarate (HEMOCYTE - 106 MG FE) 324 (106 Fe) MG TABS tablet Take 1 tablet (106 mg of iron total) by mouth daily.  Marland Kitchen lisdexamfetamine (VYVANSE) 50 MG capsule Take 50 mg by mouth daily.  Marland Kitchen OLANZapine (ZYPREXA) 5 MG tablet Take 1 tablet (5 mg total) by mouth at bedtime.  . propranolol (INDERAL) 20 MG tablet Take 20 mg by mouth as needed.     Allergies:   Mango flavor, Red dye, and Shellfish allergy   Social History   Socioeconomic History  . Marital status: Single    Spouse name: Not on file  . Number of children: Not on file  . Years of education: Not on file  . Highest education level: Not on file  Occupational History  . Not on file  Tobacco Use  . Smoking status: Never Smoker  . Smokeless  tobacco: Never Used  Substance and Sexual Activity  . Alcohol use: Not on file  . Drug use: Not on file  . Sexual activity: Not on file  Other Topics Concern  . Not on file  Social History Narrative  . Not on file   Social Determinants of Health   Financial Resource Strain:   . Difficulty of Paying Living Expenses:   Food Insecurity:   . Worried About Charity fundraiser in the Last Year:   . Arboriculturist in the Last Year:   Transportation Needs:   . Film/video editor (Medical):   Marland Kitchen Lack of Transportation (Non-Medical):   Physical Activity:   . Days of Exercise per Week:   . Minutes of Exercise per Session:   Stress:   . Feeling of Stress :   Social Connections:   . Frequency of Communication with Friends and Family:   . Frequency of Social Gatherings with Friends and Family:   . Attends Religious Services:   . Active Member of Clubs or Organizations:   . Attends Archivist Meetings:   Marland Kitchen Marital Status:      Family History: The patient's family history is not on file.  ROS:   Review of Systems  Constitution: Negative for decreased appetite, fever and weight gain.  HENT: Negative for congestion, ear discharge, hoarse voice  and sore throat.   Eyes: Negative for discharge, redness, vision loss in right eye and visual halos.  Cardiovascular: Negative for chest pain, dyspnea on exertion, leg swelling, orthopnea and palpitations.  Respiratory: Negative for cough, hemoptysis, shortness of breath and snoring.   Endocrine: Negative for heat intolerance and polyphagia.  Hematologic/Lymphatic: Negative for bleeding problem. Does not bruise/bleed easily.  Skin: Negative for flushing, nail changes, rash and suspicious lesions.  Musculoskeletal: Negative for arthritis, joint pain, muscle cramps, myalgias, neck pain and stiffness.  Gastrointestinal: Negative for abdominal pain, bowel incontinence, diarrhea and excessive appetite.  Genitourinary: Negative for  decreased libido, genital sores and incomplete emptying.  Neurological: Negative for brief paralysis, focal weakness, headaches and loss of balance.  Psychiatric/Behavioral: Negative for altered mental status, depression and suicidal ideas.  Allergic/Immunologic: Negative for HIV exposure and persistent infections.    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  Recent Labs: 10/13/2019: ALT 7; Hemoglobin 13.6; Platelets 276; TSH 0.89 10/20/2019: BUN 10; Creat 0.74; Magnesium 1.9; Potassium 4.9; Sodium 141  Recent Lipid Panel    Component Value Date/Time   CHOL 152 10/13/2019 1232   TRIG 73 10/13/2019 1232   HDL 48 (L) 10/13/2019 1232   CHOLHDL 3.2 10/13/2019 1232   LDLCALC 88 10/13/2019 1232    Physical Exam:    VS:  BP 124/88   Pulse 66   Ht 5' 2.6" (1.59 m)   Wt 184 lb (83.5 kg)   SpO2 96%   BMI 33.01 kg/m     Wt Readings from Last 3 Encounters:  10/27/19 184 lb (83.5 kg)  10/20/19 181 lb 6.4 oz (82.3 kg)  10/13/19 181 lb 6.4 oz (82.3 kg)     GEN: Well nourished, well developed in no acute distress HEENT: Normal NECK: No JVD; No carotid bruits LYMPHATICS: No lymphadenopathy CARDIAC: S1S2 noted,RRR, no murmurs, rubs, gallops RESPIRATORY:  Clear to auscultation without rales, wheezing or rhonchi  ABDOMEN: Soft, non-tender, non-distended, +bowel sounds, no guarding. EXTREMITIES: No edema, No cyanosis, no clubbing MUSCULOSKELETAL:  No deformity  SKIN: Warm and dry NEUROLOGIC:  Alert and oriented x 3, non-focal PSYCHIATRIC:  Normal affect, good insight  ASSESSMENT:    1. Palpitations   2. Dizziness   3. Sinus arrhythmia   4. Anorexia nervosa, binge eating/purging type    PLAN:     Her EKG today shows sinus arrhythmia which I discussed with the patient possible reasons for  sinus arrhythmia. I also reviewed her lab work which showed normal electrolytes. In terms of her palpitations along with dizziness will place a monitor on the patient assess for any cardiac arrhythmia. A transthoracic echocardiogram will also be done to assess RV/LV function and any other structural abnormalities.  She is pending blood work today with her PCP.  The patient is in agreement with the above plan. The patient left the office in stable condition.  The patient will follow up in 3 months or sooner if needed.   Medication Adjustments/Labs and Tests Ordered: Current medicines are reviewed at length with the patient today.  Concerns regarding medicines are outlined above.  Orders Placed This Encounter  Procedures  . LONG TERM MONITOR (3-14 DAYS)  . EKG 12-Lead  . ECHOCARDIOGRAM COMPLETE   No orders of the defined types were placed in this encounter.   Patient  Instructions  Medication Instructions:  Your physician recommends that you continue on your current medications as directed. Please refer to the Current Medication list given to you today.  *If you need a refill on your cardiac medications before your next appointment, please call your pharmacy*   Lab Work: None.  If you have labs (blood work) drawn today and your tests are completely normal, you will receive your results only by: Marland Kitchen MyChart Message (if you have MyChart) OR . A paper copy in the mail If you have any lab test that is abnormal or we need to change your treatment, we will call you to review the results.   Testing/Procedures: A zio monitor was ordered today. It will remain on for 7 days. You will then return monitor and event diary in provided box. It takes 1-2 weeks for report to be downloaded and returned to [place]. We will call you with the results. If monitor falls off or has orange flashing light, please call Zio for further instructions.   Your physician has requested that you have an echocardiogram.  Echocardiography is a painless test that uses sound waves to create images of your heart. It provides your doctor with information about the size and shape of your heart and how well your heart's chambers and valves are working. This procedure takes approximately one hour. There are no restrictions for this procedure.      Follow-Up: At [facility], you and your health needs are our priority.  As part of our continuing mission to provide you with exceptional heart care, we have created designated Provider Care Teams.  These Care Teams include your primary Cardiologist (physician) and Advanced Practice Providers (APPs -  Physician Assistants and Nurse Practitioners) who all work together to provide you with the care you need, when you need it.  We recommend signing up for the patient portal called "MyChart".  Sign up information is provided on this After Visit Summary.  MyChart is used to connect with patients for Virtual Visits (Telemedicine).  Patients are able to view lab/test results, encounter notes, upcoming appointments, etc.  Non-urgent messages can be sent to your provider as well.   To learn more about what you can do with MyChart, go to [email or web address].    Your next appointment:   3 month(s)  The format for your next appointment:   In Person  Provider:   Berniece Salines, DO   Other Instructions   Echocardiogram An echocardiogram is a procedure that uses painless sound waves (ultrasound) to produce an image of the heart. Images from an echocardiogram can provide important information about:  Signs of coronary artery disease (CAD).  Aneurysm detection. An aneurysm is a weak or damaged part of an artery wall that bulges out from the normal force of blood pumping through the body.  Heart size and shape. Changes in the size or shape of the heart can be associated with certain conditions, including heart failure, aneurysm, and CAD.  Heart muscle function.  Heart valve  function.  Signs of a past heart attack.  Fluid buildup around the heart.  Thickening of the heart muscle.  A tumor or infectious growth around the heart valves. Tell a health care provider about:  Any allergies you have.  All medicines you are taking, including vitamins, herbs, eye drops, creams, and over-the-counter medicines.  Any blood disorders you have.  Any surgeries you have had.  Any medical conditions you have.  Whether you are pregnant or may be pregnant.  What are the risks? Generally, this is a safe procedure. However, problems may occur, including:  Allergic reaction to dye (contrast) that may be used during the procedure. What happens before the procedure? No specific preparation is needed. You may eat and drink normally. What happens during the procedure?   An IV tube may be inserted into one of your veins.  You may receive contrast through this tube. A contrast is an injection that improves the quality of the pictures from your heart.  A gel will be applied to your chest.  A wand-like tool (transducer) will be moved over your chest. The gel will help to transmit the sound waves from the transducer.  The sound waves will harmlessly bounce off of your heart to allow the heart images to be captured in real-time motion. The images will be recorded on a computer. The procedure may vary among health care providers and hospitals. What happens after the procedure?  You may return to your normal, everyday life, including diet, activities, and medicines, unless your health care provider tells you not to do that. Summary  An echocardiogram is a procedure that uses painless sound waves (ultrasound) to produce an image of the heart.  Images from an echocardiogram can provide important information about the size and shape of your heart, heart muscle function, heart valve function, and fluid buildup around your heart.  You do not need to do anything to prepare  before this procedure. You may eat and drink normally.  After the echocardiogram is completed, you may return to your normal, everyday life, unless your health care provider tells you not to do that. This information is not intended to replace advice given to you by your health care provider. Make sure you discuss any questions you have with your health care provider. Document Revised: 10/10/2018 Document Reviewed: 07/22/2016 Elsevier Patient Education  [place].      Adopting a Healthy Lifestyle.  Know what a healthy weight is for you (roughly BMI <25) and aim to maintain this   Aim for 7+ servings of fruits and vegetables daily   65-80+ fluid ounces of water or unsweet tea for healthy kidneys   Limit to max 1 drink of alcohol per day; avoid smoking/tobacco   Limit animal fats in diet for cholesterol and heart health - choose grass fed whenever available   Avoid highly processed foods, and foods high in saturated/trans fats   Aim for low stress - take time to unwind and care for your mental health   Aim for 150 min of moderate intensity exercise weekly for heart health, and weights twice weekly for bone health   Aim for 7-9 hours of sleep daily   When it comes to diets, agreement about the perfect plan isnt easy to find, even among the experts. Experts at the [place] developed an idea known as the Healthy Eating Plate. Just imagine a plate divided into logical, healthy portions.   The emphasis is on diet quality:   Load up on vegetables and fruits - one-half of your plate: Aim for color and variety, and remember that potatoes dont count.   Go for whole grains - one-quarter of your plate: Whole wheat, barley, wheat berries, quinoa, oats, brown rice, and foods made with them. If you want pasta, go with whole wheat pasta.   Protein power - one-quarter of your plate: Fish, chicken, beans, and nuts are all healthy, versatile protein sources.  Limit red meat.  The diet, however, does go beyond the plate, offering a few other suggestions.   Use healthy plant oils, such as olive, canola, soy, corn, sunflower and peanut. Check the labels, and avoid partially hydrogenated oil, which have unhealthy trans fats.   If youre thirsty, drink water. Coffee and tea are good in moderation, but skip sugary drinks and limit milk and dairy products to one or two daily servings.   The type of carbohydrate in the diet is more important than the amount. Some sources of carbohydrates, such as vegetables, fruits, whole grains, and beans-are healthier than others.   Finally, stay active  Signed, Berniece Salines, DO  10/27/2019 1:47 PM    [facility]

## 2019-10-30 ENCOUNTER — Ambulatory Visit (INDEPENDENT_AMBULATORY_CARE_PROVIDER_SITE_OTHER): Payer: 59 | Admitting: Pediatrics

## 2019-10-30 ENCOUNTER — Encounter: Payer: Self-pay | Admitting: Pediatrics

## 2019-10-30 ENCOUNTER — Other Ambulatory Visit: Payer: Self-pay

## 2019-10-30 VITALS — BP 110/74 | HR 64 | Ht 63.0 in | Wt 181.0 lb

## 2019-10-30 DIAGNOSIS — Z1389 Encounter for screening for other disorder: Secondary | ICD-10-CM

## 2019-10-30 DIAGNOSIS — F5002 Anorexia nervosa, binge eating/purging type: Secondary | ICD-10-CM | POA: Diagnosis not present

## 2019-10-30 DIAGNOSIS — F902 Attention-deficit hyperactivity disorder, combined type: Secondary | ICD-10-CM

## 2019-10-30 DIAGNOSIS — K5901 Slow transit constipation: Secondary | ICD-10-CM

## 2019-10-30 DIAGNOSIS — G479 Sleep disorder, unspecified: Secondary | ICD-10-CM | POA: Diagnosis not present

## 2019-10-30 DIAGNOSIS — F4323 Adjustment disorder with mixed anxiety and depressed mood: Secondary | ICD-10-CM | POA: Diagnosis not present

## 2019-10-30 DIAGNOSIS — E538 Deficiency of other specified B group vitamins: Secondary | ICD-10-CM

## 2019-10-30 DIAGNOSIS — R9431 Abnormal electrocardiogram [ECG] [EKG]: Secondary | ICD-10-CM

## 2019-10-30 MED ORDER — CYANOCOBALAMIN 1000 MCG/ML IJ SOLN: 1000.0000 ug | Freq: Once | INTRAMUSCULAR | 0 refills | Status: AC

## 2019-10-30 MED ORDER — POLYETHYLENE GLYCOL 3350 17 GM/SCOOP PO POWD: 17.0000 g | Freq: Every day | ORAL | 6 refills | Status: DC

## 2019-10-30 MED ORDER — LISDEXAMFETAMINE DIMESYLATE 50 MG PO CAPS: 50.0000 mg | ORAL_CAPSULE | Freq: Every day | ORAL | 0 refills | Status: DC

## 2019-10-30 NOTE — Progress Notes (Signed)
History was provided by the patient.  Regina Wilkerson is a 24 y.o. female who is here for anorexia with bulimia, abnormal EKG .  No primary care provider on file.   HPI:  Pt reports that intake has been about the same   24 h recall:  B: none L: grilled chicken wrap, carrots, apple, cashew milk yogurt with powdered PB D: almonds   Hasn't purged this week.   Started olanzapine but only taking it sporadically. Doesn't notice any difference. Still taking vyvanse. Out of medication right now because they aren't calling her back.   Stays anxious all the time but not bad. Doesn't feel a need for med change today.   + constipation. Has taken laxatives- formerly was abusing them. Last time was age 76.   LMP 2 months ago. It used to be very irregular but was then on track. Denies hirsutism, acne. Unsure of family history.   Taking MVI, added vit D.   No LMP recorded.  Review of Systems  Constitutional: Negative for malaise/fatigue.  Eyes: Negative for double vision.  Respiratory: Negative for shortness of breath.   Cardiovascular: Positive for palpitations. Negative for chest pain.  Gastrointestinal: Negative for abdominal pain, constipation, diarrhea, nausea and vomiting.  Genitourinary: Negative for dysuria.  Musculoskeletal: Negative for joint pain and myalgias.  Skin: Negative for rash.  Neurological: Negative for dizziness and headaches.  Endo/Heme/Allergies: Does not bruise/bleed easily.  Psychiatric/Behavioral: Positive for depression. Negative for suicidal ideas. The patient is nervous/anxious and has insomnia.     Patient Active Problem List   Diagnosis Date Noted  . Anorexia nervosa, binge eating/purging type 10/15/2019  . Slow transit constipation 10/15/2019  . Adjustment disorder with mixed anxiety and depressed mood 10/15/2019  . Sleep disturbance 10/15/2019  . Chronic nonintractable headache 10/15/2019  . Attention deficit hyperactivity disorder (ADHD), combined type  10/15/2019    Current Outpatient Medications on File Prior to Visit  Medication Sig Dispense Refill  . Cholecalciferol (VITAMIN D) 50 MCG (2000 UT) CAPS Take by mouth.    . Ferrous Fumarate (HEMOCYTE - 106 MG FE) 324 (106 Fe) MG TABS tablet Take 1 tablet (106 mg of iron total) by mouth daily. 30 tablet 3  . propranolol (INDERAL) 20 MG tablet Take 20 mg by mouth as needed.     No current facility-administered medications on file prior to visit.    Allergies  Allergen Reactions  . Mango Flavor   . Red Dye   . Shellfish Allergy      Physical Exam:    Vitals:   10/30/19 0953  BP: 110/74  Pulse: 64  Weight: 181 lb (82.1 kg)  Height: 5\' 3"  (1.6 m)    Growth percentile SmartLinks can only be used for patients less than 53 years old.  Physical Exam Vitals and nursing note reviewed.  Constitutional:      General: She is not in acute distress.    Appearance: She is well-developed.  Neck:     Thyroid: No thyromegaly.  Cardiovascular:     Rate and Rhythm: Normal rate and regular rhythm.     Heart sounds: No murmur.  Pulmonary:     Breath sounds: Normal breath sounds.  Abdominal:     Palpations: Abdomen is soft. There is no mass.     Tenderness: There is no abdominal tenderness. There is no guarding.  Musculoskeletal:     Right lower leg: No edema.     Left lower leg: No edema.  Lymphadenopathy:  Cervical: No cervical adenopathy.  Skin:    General: Skin is warm.     Findings: No rash.  Neurological:     Mental Status: She is alert.     Comments: No tremor     Assessment/Plan: 1. Anorexia nervosa, binge eating/purging type Somewhat improved with reduced purging. Still not eating enough during the day. Discussed that I am happy to see her virtually over the summer if she desires based on where she is in her recovery.   2. Adjustment disorder with mixed anxiety and depressed mood Would likely benefit from another med trial, but not interested at this time. Is  moving home for the summer.   3. Attention deficit hyperactivity disorder (ADHD), combined type Refilled x 1 today and offered to be prescriber if she wishes given frustration with other office.   4. Sleep disturbance Will watch over the summer.   5. Abnormal EKG Will have echo upcoming from [facility].    7. Slow transit constipation Discussed miralax daily  - polyethylene glycol powder (GLYCOLAX/MIRALAX) 17 GM/SCOOP powder; Take 17 g by mouth daily.  Dispense: 578 g; Refill: 6  8. B12 deficiency Will come back for b12 injection and then have friend's mother who is a nurse give this summer.  - cyanocobalamin (,VITAMIN B-12,) 1000 MCG/ML injection; Inject 1 mL (1,000 mcg total) into the muscle once for 1 dose.  Dispense: 1 mL; Refill: 0  Jonathon Resides, FNP

## 2019-10-30 NOTE — Patient Instructions (Signed)
Come in on Monday for B12 injection  We can continue to manage your medications over the summer if you want  I would like to see you again in 1 month for a virtual visit if you are open to it  [facility]

## 2019-10-31 ENCOUNTER — Telehealth: Payer: Self-pay

## 2019-10-31 NOTE — Telephone Encounter (Signed)

## 2019-11-03 ENCOUNTER — Ambulatory Visit (INDEPENDENT_AMBULATORY_CARE_PROVIDER_SITE_OTHER): Payer: 59

## 2019-11-03 ENCOUNTER — Telehealth: Payer: Self-pay | Admitting: Cardiology

## 2019-11-03 ENCOUNTER — Other Ambulatory Visit: Payer: Self-pay

## 2019-11-03 ENCOUNTER — Ambulatory Visit (HOSPITAL_BASED_OUTPATIENT_CLINIC_OR_DEPARTMENT_OTHER)
Admission: RE | Admit: 2019-11-03 | Discharge: 2019-11-03 | Disposition: A | Discharge status: Home / Self Care | Payer: 59 | Source: Ambulatory Visit | Attending: Cardiology | Admitting: Cardiology

## 2019-11-03 DIAGNOSIS — E538 Deficiency of other specified B group vitamins: Secondary | ICD-10-CM

## 2019-11-03 DIAGNOSIS — R002 Palpitations: Secondary | ICD-10-CM | POA: Diagnosis present

## 2019-11-03 MED ORDER — CYANOCOBALAMIN 1000 MCG/ML IJ SOLN
1000.0000 ug | Freq: Once | INTRAMUSCULAR | Status: AC
  Administered 2019-11-03: 1000 ug via INTRAMUSCULAR

## 2019-11-03 NOTE — Telephone Encounter (Signed)
Patient called back to discuss her Echo results. Please call the patient back

## 2019-11-03 NOTE — Progress Notes (Signed)
  Echocardiogram 2D Echocardiogram has been performed.  Cardell Peach 11/03/2019, 10:10 AM

## 2019-11-03 NOTE — Progress Notes (Signed)
Pt here today for B12 injection. Pt supplied medication. Injection given. Tolerated well.

## 2019-11-04 NOTE — Telephone Encounter (Signed)
Follow Up:     Returning your call, concerning her test results. 

## 2019-11-04 NOTE — Telephone Encounter (Signed)
Left message on patients voicemail to please return our call.   

## 2019-11-04 NOTE — Telephone Encounter (Signed)
Spoke with patient regarding results and recommendation.  Patient verbalizes understanding and is agreeable to plan of care. Advised patient to call back with any issues or concerns.  

## 2019-11-08 ENCOUNTER — Ambulatory Visit (INDEPENDENT_AMBULATORY_CARE_PROVIDER_SITE_OTHER): Payer: No Typology Code available for payment source

## 2019-11-08 DIAGNOSIS — R002 Palpitations: Secondary | ICD-10-CM | POA: Diagnosis not present

## 2019-11-26 ENCOUNTER — Telehealth (INDEPENDENT_AMBULATORY_CARE_PROVIDER_SITE_OTHER): Payer: 59 | Admitting: Pediatrics

## 2019-11-26 ENCOUNTER — Other Ambulatory Visit: Payer: Self-pay

## 2019-11-26 DIAGNOSIS — G479 Sleep disorder, unspecified: Secondary | ICD-10-CM

## 2019-11-26 DIAGNOSIS — F4323 Adjustment disorder with mixed anxiety and depressed mood: Secondary | ICD-10-CM | POA: Diagnosis not present

## 2019-11-26 DIAGNOSIS — F902 Attention-deficit hyperactivity disorder, combined type: Secondary | ICD-10-CM

## 2019-11-26 DIAGNOSIS — F5002 Anorexia nervosa, binge eating/purging type: Secondary | ICD-10-CM | POA: Diagnosis not present

## 2019-11-26 DIAGNOSIS — K5901 Slow transit constipation: Secondary | ICD-10-CM

## 2019-11-26 MED ORDER — DULOXETINE HCL 20 MG PO CPEP: 20.0000 mg | ORAL_CAPSULE | Freq: Every day | ORAL | 3 refills | Status: DC

## 2019-11-26 NOTE — Progress Notes (Signed)
THIS RECORD MAY CONTAIN CONFIDENTIAL INFORMATION THAT SHOULD NOT BE RELEASED WITHOUT REVIEW OF THE SERVICE PROVIDER.  Virtual Follow-Up Visit via Video Note  I connected with Regina Wilkerson 's patient  on 11/26/19 at  2:00 PM EDT by a video enabled telemedicine application and verified that I am speaking with the correct person using two identifiers.   Patient/parent location: home   I discussed the limitations of evaluation and management by telemedicine and the availability of in person appointments.  I discussed that the purpose of this telehealth visit is to provide medical care while limiting exposure to the novel coronavirus.  The patient expressed understanding and agreed to proceed.   Regina Wilkerson is a 24 y.o. female referred by No ref. provider found here today for follow-up of anorexia binge/purge, adjustment disorder, sleep disturbance., ADHD  Previsit planning completed:  yes   History was provided by the patient.  Plan from Last Visit:   Continue vyvanse, continue with therapy, consider what additional support is needed   Chief Complaint: Med/DE f/u  History of Present Illness:  Pt and therapist were talking and thought it would be   Anxiety has been "kinda worse" but may be just being home. Can spiral about everything when she is already having a bad day. Doesn't like being sad, but when sad thinks about so many things. Doesn't like having entire bad days with difficulty getting through it.  Denies SI/self harm thoughts. Last time a few months ago.   Has been doing recovery record with therapist. Feels like all she is doing is eating.   24 hour recall:  D: black bean wrap- purged  Usually only eating dinner- has been doing grilled chicken wraps, almond milk yogurt, cucumbers, strawberries. Will have a protein bar earlier in the day sometimes.   Went to local psychiatry MD at home- was switched because other one had left. Rx for doxepin- has not picked up or started  taking.    Bruising a lot worse lately. No easy bleeding.   Taking vyvanse most days. Not taking adderall- feels like it doesn't help focus.    Review of Systems  Constitutional: Negative for malaise/fatigue.  Eyes: Negative for double vision.  Respiratory: Negative for shortness of breath.   Cardiovascular: Negative for chest pain and palpitations.  Gastrointestinal: Positive for constipation. Negative for abdominal pain, diarrhea, nausea and vomiting.  Genitourinary: Negative for dysuria.  Musculoskeletal: Negative for joint pain and myalgias.  Skin: Negative for rash.  Neurological: Positive for headaches. Negative for dizziness.  Endo/Heme/Allergies: Bruises/bleeds easily.  Psychiatric/Behavioral: Positive for depression. Negative for suicidal ideas. The patient is nervous/anxious and has insomnia.      Allergies  Allergen Reactions  . Mango Flavor   . Red Dye   . Shellfish Allergy    Outpatient Medications Prior to Visit  Medication Sig Dispense Refill  . Cholecalciferol (VITAMIN D) 50 MCG (2000 UT) CAPS Take by mouth.    . Ferrous Fumarate (HEMOCYTE - 106 MG FE) 324 (106 Fe) MG TABS tablet Take 1 tablet (106 mg of iron total) by mouth daily. 30 tablet 3  . lisdexamfetamine (VYVANSE) 50 MG capsule Take 1 capsule (50 mg total) by mouth daily. 30 capsule 0  . polyethylene glycol powder (GLYCOLAX/MIRALAX) 17 GM/SCOOP powder Take 17 g by mouth daily. 578 g 6  . propranolol (INDERAL) 20 MG tablet Take 20 mg by mouth as needed.     No facility-administered medications prior to visit.     Patient Active  Problem List   Diagnosis Date Noted  . Anorexia nervosa, binge eating/purging type 10/15/2019  . Slow transit constipation 10/15/2019  . Adjustment disorder with mixed anxiety and depressed mood 10/15/2019  . Sleep disturbance 10/15/2019  . Chronic nonintractable headache 10/15/2019  . Attention deficit hyperactivity disorder (ADHD), combined type 10/15/2019    The  following portions of the patient's history were reviewed and updated as appropriate: allergies, current medications, past family history, past medical history, past social history, past surgical history and problem list.  Visual Observations/Objective:   General Appearance: Well nourished well developed, in no apparent distress.  Eyes: conjunctiva no swelling or erythema ENT/Mouth: No hoarseness, No cough for duration of visit.  Neck: Supple  Respiratory: Respiratory effort normal, normal rate, no retractions or distress.   Cardio: Appears well-perfused, noncyanotic Musculoskeletal: no obvious deformity Skin: visible skin without rashes, ecchymosis, erythema Neuro: Awake and oriented X 3,  Psych:  normal affect, Insight and Judgment appropriate.    Assessment/Plan: 1. Attention deficit hyperactivity disorder (ADHD), combined type Continue vyvanse.   2. Adjustment disorder with mixed anxiety and depressed mood Will try cymbalta which may also help with her ADHD. Start at low dose and increase slowly. She is safe to herself at this time.  - DULoxetine (CYMBALTA) 20 MG capsule; Take 1 capsule (20 mg total) by mouth daily.  Dispense: 30 capsule; Refill: 3  3. Slow transit constipation Discussed miralax daily.   4. Anorexia nervosa, binge eating/purging type Is working to get established with a dietitian. She is still eating very little. We discussed the effects this has on her body. She feels very confused because she is currently no longer losing weight despite eating very little. Discussed easy bruising r/t malnutrition, but if worsening should be seen by someone in person at home.  - DULoxetine (CYMBALTA) 20 MG capsule; Take 1 capsule (20 mg total) by mouth daily.  Dispense: 30 capsule; Refill: 3  5. Sleep disturbance  Has not started doxepin yet. Discussed waiting until we see how she is going to do with cymbalta, which she was in agreement with.   I discussed the assessment and  treatment plan with the patient and/or parent/guardian.  They were provided an opportunity to ask questions and all were answered.  They agreed with the plan and demonstrated an understanding of the instructions. They were advised to call back or seek an in-person evaluation in the emergency room if the symptoms worsen or if the condition fails to improve as anticipated.   Follow-up: 2 weeks    I was located off site during this encounter.   Jonathon Resides, FNP    CC: No primary care provider on file., No ref. provider found

## 2019-11-26 NOTE — Patient Instructions (Signed)
Start cymbalta 20 mg daily  The Body is Not an Apology

## 2019-12-03 ENCOUNTER — Other Ambulatory Visit: Payer: Self-pay | Admitting: Family

## 2019-12-10 ENCOUNTER — Telehealth (INDEPENDENT_AMBULATORY_CARE_PROVIDER_SITE_OTHER): Payer: No Typology Code available for payment source | Admitting: Pediatrics

## 2019-12-10 DIAGNOSIS — G479 Sleep disorder, unspecified: Secondary | ICD-10-CM

## 2019-12-10 DIAGNOSIS — F4323 Adjustment disorder with mixed anxiety and depressed mood: Secondary | ICD-10-CM

## 2019-12-10 DIAGNOSIS — F902 Attention-deficit hyperactivity disorder, combined type: Secondary | ICD-10-CM

## 2019-12-10 DIAGNOSIS — F5002 Anorexia nervosa, binge eating/purging type: Secondary | ICD-10-CM

## 2019-12-10 NOTE — Progress Notes (Signed)
THIS RECORD MAY CONTAIN CONFIDENTIAL INFORMATION THAT SHOULD NOT BE RELEASED WITHOUT REVIEW OF THE SERVICE PROVIDER.  Virtual Follow-Up Visit via Video Note  I connected with Regina Wilkerson 's patient  on 12/10/19 at  3:30 PM EDT by a video enabled telemedicine application and verified that I am speaking with the correct person using two identifiers.   Patient/parent location: home   I discussed the limitations of evaluation and management by telemedicine and the availability of in person appointments.  I discussed that the purpose of this telehealth visit is to provide medical care while limiting exposure to the novel coronavirus.  The patient expressed understanding and agreed to proceed.   Regina Wilkerson is a 24 y.o. female referred by No ref. provider found here today for follow-up of anorexia binge/purge, adjustment disorder, sleep disturbance., ADHD  Previsit planning completed:  yes   History was provided by the patient.  Plan from Last Visit:   Continue vyvanse, continue with therapy, start cymbalta.   Chief Complaint: Med/DE f/u  History of Present Illness:  Mood: Started Cymbalta 20 mg at last visit. "It has been a weird two weeks so I can't tell if it's helped". When asked further she states her grandmother passed away two weeks ago. No side effects to the medication. No SI / HI.  Sees therapist and has been talking with her about everything.   Sleep: Sleep is the same. She typically sleeps between 3-5 hours. Then some nights when she is so exhausted from not sleeping she will actually sleep 10 hours. Has trouble falling asleep and staying asleep. Has taken meds to help with sleep in the past- trazodone and hydroxyzine that did not work. She has a script for doxepin that she has not tried yet.   DE: last time she purged (vomited) was last week. Has not seen a dietician. Explored reasons why she does not want to see a dietician. She was hesitant to answer, but ultimately stated she  didn't think she needed to. The easy bruising is the same and takes awhile for them to heal.   24 hr meal recall:  Chicken wrap    Review of Systems  Constitutional: Negative for malaise/fatigue.  Eyes: Negative for double vision.  Respiratory: Negative for shortness of breath.   Cardiovascular: Negative for chest pain and palpitations.  Gastrointestinal: Negative for abdominal pain, diarrhea, nausea and vomiting.  Genitourinary: Negative for dysuria.  Musculoskeletal: Negative for joint pain and myalgias.  Skin: Negative for rash.  Neurological: Negative for dizziness.  Endo/Heme/Allergies: Bruises/bleeds easily.  Psychiatric/Behavioral: Positive for depression. Negative for suicidal ideas. The patient is nervous/anxious and has insomnia.      Allergies  Allergen Reactions  . Mango Flavor   . Red Dye   . Shellfish Allergy    Outpatient Medications Prior to Visit  Medication Sig Dispense Refill  . Cholecalciferol (VITAMIN D) 50 MCG (2000 UT) CAPS Take by mouth.    . Doxepin HCl 3 MG TABS Take 1-2 tablets by mouth at bedtime.    . DULoxetine (CYMBALTA) 20 MG capsule Take 1 capsule (20 mg total) by mouth daily. 30 capsule 3  . Ferrous Fumarate (HEMOCYTE - 106 MG FE) 324 (106 Fe) MG TABS tablet Take 1 tablet (106 mg of iron total) by mouth daily. 30 tablet 3  . lisdexamfetamine (VYVANSE) 50 MG capsule Take 1 capsule (50 mg total) by mouth daily. 30 capsule 0  . polyethylene glycol powder (GLYCOLAX/MIRALAX) 17 GM/SCOOP powder Take 17 g by mouth  daily. 578 g 6  . propranolol (INDERAL) 20 MG tablet Take 20 mg by mouth as needed.     No facility-administered medications prior to visit.     Patient Active Problem List   Diagnosis Date Noted  . Anorexia nervosa, binge eating/purging type 10/15/2019  . Slow transit constipation 10/15/2019  . Adjustment disorder with mixed anxiety and depressed mood 10/15/2019  . Sleep disturbance 10/15/2019  . Chronic nonintractable headache  10/15/2019  . Attention deficit hyperactivity disorder (ADHD), combined type 10/15/2019    The following portions of the patient's history were reviewed and updated as appropriate: allergies, current medications, past family history, past medical history, past social history, past surgical history and problem list.  Visual Observations/Objective:   General Appearance: Well nourished well developed, in no apparent distress.  Eyes: conjunctiva no swelling or erythema ENT/Mouth: No hoarseness, No cough for duration of visit.  Neck: Supple  Respiratory: Respiratory effort normal, normal rate, no retractions or distress.   Cardio: Appears well-perfused, noncyanotic Musculoskeletal: no obvious deformity Skin: visible skin without rashes, ecchymosis, erythema Neuro: Awake and oriented X 3,  Psych:  normal affect, Insight and Judgment appropriate.    Assessment/Plan: Attention deficit hyperactivity disorder (ADHD), combined type Continue vyvanse.   Adjustment disorder with mixed anxiety and depressed mood - No SI, safe to self - Continue cymbalta. Discussed increasing dose but patient prefers to stay at this dose for a month and then re assess  - DULoxetine (CYMBALTA) 20 MG capsule; Take 1 capsule (20 mg total) by mouth daily.  Dispense: 30 capsule; Refill: 3  Anorexia nervosa, binge eating/purging type - Has not established with a dietician. She is still eating very little. We discussed the effects this has on her body and importance of nutrition on overall health. Recommend she see a dietician.  - DULoxetine (CYMBALTA) 20 MG capsule; Take 1 capsule (20 mg total) by mouth daily.  Dispense: 30 capsule; Refill: 3  Sleep disturbance  - Start doxepin 3 mg nightly (already prescribed). Can increase to 6 mg as needed  I discussed the assessment and treatment plan with the patient and/or parent/guardian.  They were provided an opportunity to ask questions and all were answered.  They agreed  with the plan and demonstrated an understanding of the instructions. They were advised to call back or seek an in-person evaluation in the emergency room if the symptoms worsen or if the condition fails to improve as anticipated.   Follow-up: 4 weeks    I was located off site during this encounter.   Karn Cassis, MD    CC: No primary care provider on file., No ref. provider found

## 2019-12-10 NOTE — Patient Instructions (Signed)
Start Doxepin 3 mg nightly for sleep. You can increase to 6 mg nightly if 3 mg is not effective. The prescription is ready at your pharmacy.   Continue Cymbalta 20 mg daily.

## 2019-12-11 NOTE — Progress Notes (Signed)
I have reviewed the resident's note and plan of care and helped develop the plan as necessary.  I think she would likely benefit from increase to 30-40 mg of cymbalta, but she preferred to stay at current dose for now which is fine. Taking doxepin at night may help sleep, which may help mood as well.   Jonathon Resides, FNP

## 2020-01-07 ENCOUNTER — Telehealth (INDEPENDENT_AMBULATORY_CARE_PROVIDER_SITE_OTHER): Payer: No Typology Code available for payment source | Admitting: Pediatrics

## 2020-01-07 DIAGNOSIS — F5002 Anorexia nervosa, binge eating/purging type: Secondary | ICD-10-CM

## 2020-01-07 DIAGNOSIS — K5901 Slow transit constipation: Secondary | ICD-10-CM

## 2020-01-07 DIAGNOSIS — F902 Attention-deficit hyperactivity disorder, combined type: Secondary | ICD-10-CM | POA: Diagnosis not present

## 2020-01-07 DIAGNOSIS — K219 Gastro-esophageal reflux disease without esophagitis: Secondary | ICD-10-CM | POA: Insufficient documentation

## 2020-01-07 DIAGNOSIS — G479 Sleep disorder, unspecified: Secondary | ICD-10-CM

## 2020-01-07 DIAGNOSIS — F4323 Adjustment disorder with mixed anxiety and depressed mood: Secondary | ICD-10-CM

## 2020-01-07 MED ORDER — DOXEPIN HCL 3 MG PO TABS: 1.0000 | ORAL_TABLET | Freq: Every day | ORAL | 3 refills | Status: DC

## 2020-01-07 MED ORDER — PANTOPRAZOLE SODIUM 40 MG PO TBEC: 40.0000 mg | DELAYED_RELEASE_TABLET | Freq: Every day | ORAL | 3 refills | Status: DC

## 2020-01-07 NOTE — Progress Notes (Signed)
THIS RECORD MAY CONTAIN CONFIDENTIAL INFORMATION THAT SHOULD NOT BE RELEASED WITHOUT REVIEW OF THE SERVICE PROVIDER.  Virtual Follow-Up Visit via Video Note  I connected with Regina Wilkerson 's patient  on 01/07/20 at  3:00 PM EDT by a video enabled telemedicine application and verified that I am speaking with the correct person using two identifiers.   Patient/parent location: home   I discussed the limitations of evaluation and management by telemedicine and the availability of in person appointments.  I discussed that the purpose of this telehealth visit is to provide medical care while limiting exposure to the novel coronavirus.  The patient expressed understanding and agreed to proceed.   Regina Wilkerson is a 24 y.o. female referred by No ref. provider found here today for follow-up of anorexia, anxiety, depression, sleep disturbance.  Previsit planning completed:  yes   History was provided by the patient.  Plan from Last Visit:   Continue cymbalta, try doxepin   Chief Complaint: Med f/u  History of Present Illness:  Stressed as usual. She is visiting where grandmother lived and had to clean out house etc- they are having family meals as well and she can't cook her own stuff so that's hard.  Stomach hurts a lot 90% of the time. Usually happens when she starts eating more. Having burning, gnawing stomach pain, nausea. Has never taken a PPI. tums don't help.   Never tried sleep medication. Takes a long time to fall asleep sometimes.   Has one bad day a week where she overthinks things and is terrible. Other days, she is generally ok. Still with some anxiety. Hesitant toward changing things.    Review of Systems  Constitutional: Negative for malaise/fatigue.  Eyes: Negative for double vision.  Respiratory: Negative for shortness of breath.   Cardiovascular: Negative for chest pain and palpitations.  Gastrointestinal: Positive for abdominal pain and vomiting. Negative for constipation,  diarrhea and nausea.  Genitourinary: Negative for dysuria.  Musculoskeletal: Negative for joint pain and myalgias.  Skin: Negative for rash.  Neurological: Negative for dizziness and headaches.  Endo/Heme/Allergies: Does not bruise/bleed easily.  Psychiatric/Behavioral: Positive for depression. The patient is nervous/anxious and has insomnia.      Allergies  Allergen Reactions  . Mango Flavor   . Red Dye   . Shellfish Allergy    Outpatient Medications Prior to Visit  Medication Sig Dispense Refill  . Cholecalciferol (VITAMIN D) 50 MCG (2000 UT) CAPS Take by mouth.    . Doxepin HCl 3 MG TABS Take 1-2 tablets by mouth at bedtime.    . DULoxetine (CYMBALTA) 20 MG capsule Take 1 capsule (20 mg total) by mouth daily. 30 capsule 3  . Ferrous Fumarate (HEMOCYTE - 106 MG FE) 324 (106 Fe) MG TABS tablet Take 1 tablet (106 mg of iron total) by mouth daily. 30 tablet 3  . lisdexamfetamine (VYVANSE) 50 MG capsule Take 1 capsule (50 mg total) by mouth daily. 30 capsule 0  . polyethylene glycol powder (GLYCOLAX/MIRALAX) 17 GM/SCOOP powder Take 17 g by mouth daily. 578 g 6  . propranolol (INDERAL) 20 MG tablet Take 20 mg by mouth as needed.     No facility-administered medications prior to visit.     Patient Active Problem List   Diagnosis Date Noted  . Anorexia nervosa, binge eating/purging type 10/15/2019  . Slow transit constipation 10/15/2019  . Adjustment disorder with mixed anxiety and depressed mood 10/15/2019  . Sleep disturbance 10/15/2019  . Chronic nonintractable headache 10/15/2019  .  Attention deficit hyperactivity disorder (ADHD), combined type 10/15/2019    The following portions of the patient's history were reviewed and updated as appropriate: allergies, current medications, past family history, past medical history, past social history, past surgical history and problem list.  Visual Observations/Objective:   General Appearance: Well nourished well developed, in no  apparent distress.  Eyes: conjunctiva no swelling or erythema ENT/Mouth: No hoarseness, No cough for duration of visit.  Neck: Supple  Respiratory: Respiratory effort normal, normal rate, no retractions or distress.   Cardio: Appears well-perfused, noncyanotic Musculoskeletal: no obvious deformity Skin: visible skin without rashes, ecchymosis, erythema Neuro: Awake and oriented X 3,  Psych:  normal affect, Insight and Judgment appropriate.    Assessment/Plan: 1. Attention deficit hyperactivity disorder (ADHD), combined type Continue vyvanse and adderall per home psychiatrist.   2. Adjustment disorder with mixed anxiety and depressed mood Continue cymbalta- plan to increase when she comes back to [employer]. Continue with therapist.   3. Anorexia nervosa, binge eating/purging type Having more purging recently which has been a challenge related to stress of grandmother's death. Discussed other coping skills she might be able to use.   4. Slow transit constipation Stable.   5. Sleep disturbance Was not able to pick up previously- will resend.  - Doxepin HCl 3 MG TABS; Take 1-2 tablets (3-6 mg total) by mouth at bedtime.  Dispense: 30 tablet; Refill: 3  6. Gastroesophageal reflux disease, unspecified whether esophagitis present Will start pantoprazole. Discussed risks of esophageal tears r/t purging. Will work to decrease acid overall.  - pantoprazole (PROTONIX) 40 MG tablet; Take 1 tablet (40 mg total) by mouth daily.  Dispense: 30 tablet; Refill: 3    I discussed the assessment and treatment plan with the patient and/or parent/guardian.  They were provided an opportunity to ask questions and all were answered.  They agreed with the plan and demonstrated an understanding of the instructions. They were advised to call back or seek an in-person evaluation in the emergency room if the symptoms worsen or if the condition fails to improve as anticipated.   Follow-up:  6 weeks or  sooner   I was located off site during this encounter.   Regina Resides, FNP    CC: No primary care provider on file., No ref. provider found

## 2020-01-19 ENCOUNTER — Other Ambulatory Visit: Payer: Self-pay | Admitting: Pediatrics

## 2020-01-19 DIAGNOSIS — F5002 Anorexia nervosa, binge eating/purging type: Secondary | ICD-10-CM

## 2020-02-26 ENCOUNTER — Ambulatory Visit: Payer: No Typology Code available for payment source | Admitting: Pediatrics

## 2020-02-26 ENCOUNTER — Other Ambulatory Visit: Payer: Self-pay

## 2020-02-26 VITALS — BP 99/76 | HR 75 | Ht 62.76 in | Wt 171.4 lb

## 2020-02-26 DIAGNOSIS — F5002 Anorexia nervosa, binge eating/purging type: Secondary | ICD-10-CM

## 2020-02-26 DIAGNOSIS — Z23 Encounter for immunization: Secondary | ICD-10-CM | POA: Diagnosis not present

## 2020-02-26 DIAGNOSIS — F4323 Adjustment disorder with mixed anxiety and depressed mood: Secondary | ICD-10-CM

## 2020-02-26 DIAGNOSIS — G479 Sleep disorder, unspecified: Secondary | ICD-10-CM

## 2020-02-26 DIAGNOSIS — F902 Attention-deficit hyperactivity disorder, combined type: Secondary | ICD-10-CM

## 2020-02-26 MED ORDER — LISDEXAMFETAMINE DIMESYLATE 60 MG PO CAPS: 60.0000 mg | ORAL_CAPSULE | ORAL | 0 refills | Status: DC

## 2020-02-26 MED ORDER — LEVONORGESTREL 1.5 MG PO TABS: 1.5000 mg | ORAL_TABLET | Freq: Once | ORAL | 3 refills | Status: AC

## 2020-02-26 MED ORDER — AMPHETAMINE-DEXTROAMPHETAMINE 15 MG PO TABS: 15.0000 mg | ORAL_TABLET | Freq: Every day | ORAL | 0 refills | Status: DC

## 2020-02-26 MED ORDER — DULOXETINE HCL 20 MG PO CPEP: 20.0000 mg | ORAL_CAPSULE | Freq: Two times a day (BID) | ORAL | 3 refills | Status: DC

## 2020-02-26 NOTE — Patient Instructions (Addendum)
Increase vyvanse to 60 mg daily. Improving your food intake will also help with your focus.  If you need a booster in the afternoon, we have increased adderall 15 mg daily  Increase cymbalta to 20 mg TWICE daily   Go to dietitian next week   Go to [email or web address].org for more information about birth control options  I have sent plan B to the pharmacy   Put your miralax in gatorade zero or something similar. Let it sit for 5-10 minutes before you drink it.   Eat more close to bedtime. Risks of death overnight from cardiac arrhythmia or hypoglycemia are important to think about. Please consider adding a clif bar, luna bar or ensure daily.   Please call for EKG- [phone]

## 2020-02-26 NOTE — Progress Notes (Signed)
History was provided by the patient.  Regina Wilkerson is a 24 y.o. female who is here for anorexia, ADHD, anxiety, depression, sleep issues.  No primary care provider on file.   HPI:  Pt reports she is back in [place] and started the semester.   She got up too fast one morning and blacked out. She got dizzy and laid down before she fell. This has happened at other times too. After the events she doesn't have any other physical sx. She is drinking about 80 oz water a day.   Heart burn is some better. Taking meds every day.   She started doxepin- she stopped taking it because she felt really groggy in the morning. Without meds she is taking melatonin- sometimes sleeps through the night, sometimes not. She is going to bed early because she is tired.    She gets sad in the evening when her cymbalta wears off. No SI/HI.   Taking vvyanse 50 mg. She still can't focus.   24 hour recall:  B-D: toast with banana and PB, avocado, cucumber, almond milk yogurt, bar, vegan chicken, cauliflower potato things, apple   Feels like this was too much food. She did not purge. Last time was about July.   Feels like she is using some coping skills that aren't great- she is sexually active with a female partner who she says she doesn't know if he really likes her, smokes MJ about 1-2 times a week. She has used plan B a few times, but is concerned about a regular form of birth control causing weight gain. She does not desire pregnancy and would terminate if it came to that.   Reports that her apple watch indicates that her resting HR is in the low-mid 40s and has gone into the 30s overnight before.   Some struggles with therapist but does not desire a change today. She is seeing dietitian next week. She is adamantly against a higher level of care.   No LMP recorded.  Review of Systems  Constitutional: Positive for malaise/fatigue.  Eyes: Negative for double vision.  Respiratory: Negative for shortness of  breath.   Cardiovascular: Negative for chest pain and palpitations.  Gastrointestinal: Positive for constipation, heartburn and nausea. Negative for abdominal pain, diarrhea and vomiting.  Genitourinary: Negative for dysuria.  Musculoskeletal: Negative for joint pain and myalgias.  Skin: Negative for rash.  Neurological: Positive for dizziness and headaches.  Endo/Heme/Allergies: Does not bruise/bleed easily.  Psychiatric/Behavioral: Positive for depression. Negative for suicidal ideas. The patient is nervous/anxious. The patient does not have insomnia.     Patient Active Problem List   Diagnosis Date Noted  . Gastroesophageal reflux disease 01/07/2020  . Anorexia nervosa, binge eating/purging type 10/15/2019  . Slow transit constipation 10/15/2019  . Adjustment disorder with mixed anxiety and depressed mood 10/15/2019  . Sleep disturbance 10/15/2019  . Chronic nonintractable headache 10/15/2019  . Attention deficit hyperactivity disorder (ADHD), combined type 10/15/2019    Current Outpatient Medications on File Prior to Visit  Medication Sig Dispense Refill  . amphetamine-dextroamphetamine (ADDERALL) 10 MG tablet Take 10 mg by mouth daily.    . Cholecalciferol (VITAMIN D) 50 MCG (2000 UT) CAPS Take by mouth.    . Doxepin HCl 3 MG TABS Take 1-2 tablets (3-6 mg total) by mouth at bedtime. 30 tablet 3  . DULoxetine (CYMBALTA) 20 MG capsule Take 1 capsule (20 mg total) by mouth daily. 30 capsule 3  . Ferrous Fumarate (HEMOCYTE - 106 MG FE) 324 (  106 Fe) MG TABS tablet Take 1 tablet (106 mg of iron total) by mouth daily. 30 tablet 3  . lisdexamfetamine (VYVANSE) 50 MG capsule Take 1 capsule (50 mg total) by mouth daily. 30 capsule 0  . pantoprazole (PROTONIX) 40 MG tablet Take 1 tablet (40 mg total) by mouth daily. 30 tablet 3  . polyethylene glycol powder (GLYCOLAX/MIRALAX) 17 GM/SCOOP powder Take 17 g by mouth daily. 578 g 6  . propranolol (INDERAL) 20 MG tablet Take 20 mg by mouth as  needed.     No current facility-administered medications on file prior to visit.    Allergies  Allergen Reactions  . Mango Flavor   . Red Dye   . Shellfish Allergy     Social History: Confidentiality was discussed with the patient and if applicable, with caregiver as well. Tobacco: occ  Secondhand smoke exposure? no Drugs/EtOH: MJ. No ETOH Sexually active? yes - one female partner   Safety: safe to self and at home  Last STI Screening: 2021 Pregnancy Prevention: plan B and condoms   Physical Exam:    Vitals:   02/26/20 1029  BP: 99/76  Pulse: 75  Weight: 171 lb 6.4 oz (77.7 kg)  Height: 5' 2.76" (1.594 m)    Growth percentile SmartLinks can only be used for patients less than 29 years old.  Physical Exam Vitals and nursing note reviewed.  Constitutional:      General: She is not in acute distress.    Appearance: She is well-developed.  Neck:     Thyroid: No thyromegaly.  Cardiovascular:     Rate and Rhythm: Normal rate and regular rhythm.     Pulses:          Radial pulses are 1+ on the right side and 1+ on the left side.     Heart sounds: No murmur heard.   Pulmonary:     Breath sounds: Normal breath sounds.  Abdominal:     Palpations: Abdomen is soft. There is no mass.     Tenderness: There is no abdominal tenderness. There is no guarding.  Musculoskeletal:     Right lower leg: No edema.     Left lower leg: No edema.  Lymphadenopathy:     Cervical: No cervical adenopathy.  Skin:    General: Skin is warm.     Capillary Refill: Capillary refill takes 2 to 3 seconds.     Findings: No rash.  Neurological:     Mental Status: She is alert.     Comments: No tremor     Assessment/Plan: 1. Adjustment disorder with mixed anxiety and depressed mood We will increase cymbalta and divide the doses into AM and PM given that she is having some waning effect in the PM. She was agreeable. Continues with therapy.   - DULoxetine (CYMBALTA) 20 MG capsule; Take 1  capsule (20 mg total) by mouth 2 (two) times daily.  Dispense: 60 capsule; Refill: 3  2. Anorexia nervosa, binge eating/purging type Repeat labs and EKG today due to ongoing weight loss and symptoms. Discussed effects on the body including risks of death with bradycardia/arrthythmia and hypoglycemia. She acknowledged understanding.  - Comprehensive metabolic panel - Magnesium - Phosphorus - EKG 12-Lead - Ferritin - Amylase - Lipase - DULoxetine (CYMBALTA) 20 MG capsule; Take 1 capsule (20 mg total) by mouth 2 (two) times daily.  Dispense: 60 capsule; Refill: 3  3. Attention deficit hyperactivity disorder (ADHD), combined type Will increase vyvanse and adderall slightly to see  if this helps ADHD sx at all, but discussed that it could be r/t malnutrition.  - lisdexamfetamine (VYVANSE) 60 MG capsule; Take 1 capsule (60 mg total) by mouth every morning.  Dispense: 30 capsule; Refill: 0 - amphetamine-dextroamphetamine (ADDERALL) 15 MG tablet; Take 1 tablet by mouth daily.  Dispense: 30 tablet; Refill: 0  4. Sleep disturbance Continue to monitor.   5. Need for vaccine  HPV, menactra and tdap due today.   Return in 2 weeks.   Jonathon Resides, FNP

## 2020-02-27 LAB — COMPREHENSIVE METABOLIC PANEL
AG Ratio: 1.8 (calc) (ref 1.0–2.5)
ALT: 6 U/L (ref 6–29)
AST: 10 U/L (ref 10–30)
Albumin: 4.6 g/dL (ref 3.6–5.1)
Alkaline phosphatase (APISO): 29 U/L — ABNORMAL LOW (ref 31–125)
BUN: 10 mg/dL (ref 7–25)
CO2: 27 mmol/L (ref 20–32)
Calcium: 9.9 mg/dL (ref 8.6–10.2)
Chloride: 103 mmol/L (ref 98–110)
Creat: 0.81 mg/dL (ref 0.50–1.10)
Globulin: 2.6 g/dL (calc) (ref 1.9–3.7)
Glucose, Bld: 87 mg/dL (ref 65–99)
Potassium: 3.9 mmol/L (ref 3.5–5.3)
Sodium: 140 mmol/L (ref 135–146)
Total Bilirubin: 0.5 mg/dL (ref 0.2–1.2)
Total Protein: 7.2 g/dL (ref 6.1–8.1)

## 2020-02-27 LAB — LIPASE: Lipase: 25 U/L (ref 7–60)

## 2020-02-27 LAB — EXTRA LAV TOP TUBE

## 2020-02-27 LAB — MAGNESIUM: Magnesium: 2 mg/dL (ref 1.5–2.5)

## 2020-02-27 LAB — FERRITIN: Ferritin: 10 ng/mL — ABNORMAL LOW (ref 16–154)

## 2020-02-27 LAB — AMYLASE: Amylase: 27 U/L (ref 21–101)

## 2020-02-27 LAB — PHOSPHORUS: Phosphorus: 4.4 mg/dL (ref 2.5–4.5)

## 2020-03-02 ENCOUNTER — Other Ambulatory Visit: Payer: Self-pay

## 2020-03-02 ENCOUNTER — Encounter: Payer: No Typology Code available for payment source | Attending: Pediatrics | Admitting: Registered"

## 2020-03-02 ENCOUNTER — Ambulatory Visit (HOSPITAL_COMMUNITY)
Admission: RE | Admit: 2020-03-02 | Discharge: 2020-03-02 | Disposition: A | Discharge status: Home / Self Care | Payer: No Typology Code available for payment source | Source: Ambulatory Visit | Attending: Pediatrics | Admitting: Pediatrics

## 2020-03-02 DIAGNOSIS — F5002 Anorexia nervosa, binge eating/purging type: Secondary | ICD-10-CM | POA: Insufficient documentation

## 2020-03-02 DIAGNOSIS — Z713 Dietary counseling and surveillance: Secondary | ICD-10-CM | POA: Diagnosis not present

## 2020-03-02 NOTE — Progress Notes (Signed)
Appointment start time: 4:20  Appointment end time: 5:19  Patient was seen on 03/02/2020 for nutrition counseling pertaining to disordered eating  Primary care provider: Jonathon Resides, FNP Therapist: Vassie Moselle (sees weekly)  ROI: 03/02/2020 Any other medical team members: adolescent medicine Parents: none   Assessment  States she initially started seeing therapist in Oct 2020 after having mental breakdown. States therapist referred her to [place] and myself.   Pt states she initially wanted to lose COVID summer (2020) weight by restricting. States she then wanted to continue not eating food because she didn't like the way it made her feel. States she does not like to feel full or the way food feels in her stomach. States she thinks bad things will happen if she doesn't restrict.   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    Eating history: Length of time: 7 years ago, age 31 Previous treatments: no Goals for RD meetings: improve constipation, dizziness/lightheadedness, headaches  Weight history:  Highest weight:    Lowest weight:  Most consistent weight:   What would you like to weigh: N/A How has weight changed in the past year: up and down  Medical Information:  Changes in hair, skin, nails since ED started: hair loss, nails easily breaking, easily bruises Chewing/swallowing difficulties: no Reflux or heartburn: yes, taking meds Trouble with teeth: yes, recently tooth cracked LMP without the use of hormones: 8/7 Effect of exercise on menses: no    Constipation, diarrhea: yes, constipated has BM 1-2x/week; supposed to be taking Miralax and doesn't take it. Doesn't like the taste Dizziness/lightheadedness: yes, a few times/week Headaches/body aches: yes, daily Heart racing/chest pain: sometimes Mood:  Sleep: bad; sleeps 3-8 hrs/night; usually like 4-5 hrs Focus/concentration: yes, challenges. Has ADHD Cold intolerance: yes Vision changes: states she should be  wearing glasses but doesn't wear them. Does not like that they become foggy when wearing mask.   Mental health diagnosis: AN, binge purge type   Dietary assessment: A typical day consists of 1 meals and 1-2 snacks  Safe foods include: pickles, strawberries, grilled chicken wrap, protein bar, carrots, cucumbers, almond thins, [place], apples, almond milk yogurt, cheerios, toast Avoided foods include: all other items  24 hour recall:  B (11 am): oat milk + espresso S (3:30 pm): 1 c carrots L S D (10 pm): [employer] - med fries S Beverages: oat milk + espresso, water (3*24 oz; 72 oz)  Physical activity: hula hoop 60-90 min, as time permits  What Methods Do You Use To Control Your Weight (Compensatory behaviors)?           Restricting   SIV  Exercise - hula hoop  Food rules or rituals - doesn't want to talk about it  Binge - not anymore  Estimated energy intake: ~500 kcal  Estimated energy needs: 2000-2200 kcal 250-275 g CHO 125-138 g pro 56-61 g fat  Nutrition Diagnosis: NB-1.5 Disordered eating pattern As related to harmful beliefs about eating.  As evidenced by restriction of food.  Intervention/Goals: Pt was educated and counseled on eating to nourish the body, signs/symptoms of not being adequately nourished, and ways to increase nourishment. Pt was in agreement with goals listed. Goals: - Have grilled chicken wrap (chicken, vegetables, tortilla) + fruit once a day.   Meal plan:    1-2 meals    1-2 snacks  Monitoring and Evaluation: Patient will follow up in 3 weeks due to provider availability.

## 2020-03-02 NOTE — Patient Instructions (Signed)
-   Have grilled chicken wrap (chicken, vegetables, tortilla) + fruit once a day.

## 2020-03-10 ENCOUNTER — Other Ambulatory Visit: Payer: Self-pay | Admitting: Pediatrics

## 2020-03-10 DIAGNOSIS — E611 Iron deficiency: Secondary | ICD-10-CM

## 2020-03-10 MED ORDER — FERROUS FUMARATE 324 (106 FE) MG PO TABS: 1.0000 | ORAL_TABLET | Freq: Every day | ORAL | 3 refills | Status: DC

## 2020-03-11 ENCOUNTER — Encounter: Payer: Self-pay | Admitting: Pediatrics

## 2020-03-11 ENCOUNTER — Ambulatory Visit (INDEPENDENT_AMBULATORY_CARE_PROVIDER_SITE_OTHER): Payer: No Typology Code available for payment source | Admitting: Pediatrics

## 2020-03-11 VITALS — BP 117/81 | HR 70 | Ht 62.6 in | Wt 172.0 lb

## 2020-03-11 DIAGNOSIS — F4323 Adjustment disorder with mixed anxiety and depressed mood: Secondary | ICD-10-CM | POA: Diagnosis not present

## 2020-03-11 DIAGNOSIS — Z3202 Encounter for pregnancy test, result negative: Secondary | ICD-10-CM | POA: Diagnosis not present

## 2020-03-11 DIAGNOSIS — Z30011 Encounter for initial prescription of contraceptive pills: Secondary | ICD-10-CM

## 2020-03-11 DIAGNOSIS — K5901 Slow transit constipation: Secondary | ICD-10-CM

## 2020-03-11 DIAGNOSIS — F50029 Anorexia nervosa, binge eating/purging type, unspecified: Secondary | ICD-10-CM

## 2020-03-11 DIAGNOSIS — G479 Sleep disorder, unspecified: Secondary | ICD-10-CM | POA: Diagnosis not present

## 2020-03-11 DIAGNOSIS — F5002 Anorexia nervosa, binge eating/purging type: Secondary | ICD-10-CM | POA: Diagnosis not present

## 2020-03-11 DIAGNOSIS — F902 Attention-deficit hyperactivity disorder, combined type: Secondary | ICD-10-CM

## 2020-03-11 DIAGNOSIS — Z1389 Encounter for screening for other disorder: Secondary | ICD-10-CM

## 2020-03-11 MED ORDER — ESCITALOPRAM OXALATE 10 MG PO TABS: 10.0000 mg | ORAL_TABLET | Freq: Every day | ORAL | 2 refills | Status: DC

## 2020-03-11 MED ORDER — NORETHIN ACE-ETH ESTRAD-FE 1-20 MG-MCG PO TABS: 1.0000 | ORAL_TABLET | Freq: Every day | ORAL | 3 refills | Status: DC

## 2020-03-11 NOTE — Patient Instructions (Addendum)
Pick up iron and start taking daily  Start taking birth control pill now. You need to continue to use back up protection.  Eat one meal a day with your roommate if you can   Iron Dextran injection What is this medicine? IRON DEXTRAN (AHY ern DEX tran) is an iron complex. Iron is used to make healthy red blood cells, which carry oxygen and nutrients through the body. This medicine is used to treat people who cannot take iron by mouth and have low levels of iron in the blood. This medicine may be used for other purposes; ask your health care provider or pharmacist if you have questions. COMMON BRAND NAME(S): Dexferrum, INFeD What should I tell my health care provider before I take this medicine? They need to know if you have any of these conditions:  anemia not caused by low iron levels  heart disease  high levels of iron in the blood  kidney disease  liver disease  an unusual or allergic reaction to iron, other medicines, foods, dyes, or preservatives  pregnant or trying to get pregnant  breast-feeding How should I use this medicine? This medicine is for injection into a vein or a muscle. It is given by a health care professional in a hospital or clinic setting. Talk to your pediatrician regarding the use of this medicine in children. While this drug may be prescribed for children as young as 47 months old for selected conditions, precautions do apply. Overdosage: If you think you have taken too much of this medicine contact a poison control center or emergency room at once. NOTE: This medicine is only for you. Do not share this medicine with others. What if I miss a dose? It is important not to miss your dose. Call your doctor or health care professional if you are unable to keep an appointment. What may interact with this medicine? Do not take this medicine with any of the following medications:  deferoxamine  dimercaprol  other iron products This medicine may also interact  with the following medications:  chloramphenicol  deferasirox This list may not describe all possible interactions. Give your health care provider a list of all the medicines, herbs, non-prescription drugs, or dietary supplements you use. Also tell them if you smoke, drink alcohol, or use illegal drugs. Some items may interact with your medicine. What should I watch for while using this medicine? Visit your doctor or health care professional regularly. Tell your doctor if your symptoms do not start to get better or if they get worse. You may need blood work done while you are taking this medicine. You may need to follow a special diet. Talk to your doctor. Foods that contain iron include: whole grains/cereals, dried fruits, beans, or peas, leafy green vegetables, and organ meats (liver, kidney). Long-term use of this medicine may increase your risk of some cancers. Talk to your doctor about how to limit your risk. What side effects may I notice from receiving this medicine? Side effects that you should report to your doctor or health care professional as soon as possible:  allergic reactions like skin rash, itching or hives, swelling of the face, lips, or tongue  blue lips, nails, or skin  breathing problems  changes in blood pressure  chest pain  confusion  fast, irregular heartbeat  feeling faint or lightheaded, falls  fever or chills  flushing, sweating, or hot feelings  joint or muscle aches or pains  pain, tingling, numbness in the hands or feet  seizures  unusually weak or tired Side effects that usually do not require medical attention (report to your doctor or health care professional if they continue or are bothersome):  change in taste (metallic taste)  diarrhea  headache  irritation at site where injected  nausea, vomiting  stomach upset This list may not describe all possible side effects. Call your doctor for medical advice about side effects. You may  report side effects to FDA at 1-800-FDA-1088. Where should I keep my medicine? This drug is given in a hospital or clinic and will not be stored at home. NOTE: This sheet is a summary. It may not cover all possible information. If you have questions about this medicine, talk to your doctor, pharmacist, or health care provider.  2020 Elsevier/Gold Standard (2007-11-05 16:59:50)

## 2020-03-11 NOTE — Progress Notes (Signed)
History was provided by the patient.  Regina Wilkerson is a 24 y.o. female who is here for anorexia with bulimia, anxiety, depression, insomnia, contraception.  Regina Mcburney, FNP   HPI:  Pt reports that she saw dietitian. She says she "just existed" and was kind of bored. One goal was to eat chicken and fruit once a day. It doesn't always happen. She just doesn't feel like cooking some days. Her roommate is a good support and often makes her eat at least one meal, though their schedules have been very different from each other this semester.   She took cymbalta for about 1 week total. It was making her more sad. She is still sad, but not to that extent. She is agreeable to trying something else given that she feels like she "needs to get it together."   She did not pick up the sleep medication because she forgot. She is actually sleeping well at this point. She picked up new vyvanse and adderall doses but can't tell much of a difference.   Using aleve two tablets daily for headaches. They are daily. She sometimes wakes with them- a few times a week. She is not wearing her glasses like she should. She did have her eyes checked this summer.   Really tired. She will get ready for bed at 11:30 pm and is asleep before midnight. She wakes at 8:30 but is still tired.   24 hour recall:  B: quest bar, almond milk yogurt, apple, carrots, rx bar. Water.   She is still concerned about starting a method of contraception because she is worried about weight gain, though she absolutely doesn't want a pregnancy. She has used plan B once recently when she had unprotected sex.   Patient's last menstrual period was 02/23/2020.  Review of Systems  Constitutional: Positive for malaise/fatigue.  HENT: Negative for sore throat.   Eyes: Positive for blurred vision. Negative for double vision.  Respiratory: Negative for shortness of breath.   Cardiovascular: Negative for chest pain and palpitations.   Gastrointestinal: Positive for constipation and heartburn. Negative for abdominal pain, diarrhea, nausea and vomiting.  Genitourinary: Negative for dysuria.  Musculoskeletal: Negative for joint pain and myalgias.  Skin: Negative for rash.  Neurological: Positive for dizziness and headaches.  Endo/Heme/Allergies: Does not bruise/bleed easily.    Patient Active Problem List   Diagnosis Date Noted  . Gastroesophageal reflux disease 01/07/2020  . Anorexia nervosa, binge eating/purging type 10/15/2019  . Slow transit constipation 10/15/2019  . Adjustment disorder with mixed anxiety and depressed mood 10/15/2019  . Sleep disturbance 10/15/2019  . Chronic nonintractable headache 10/15/2019  . Attention deficit hyperactivity disorder (ADHD), combined type 10/15/2019    Current Outpatient Medications on File Prior to Visit  Medication Sig Dispense Refill  . amphetamine-dextroamphetamine (ADDERALL) 15 MG tablet Take 1 tablet by mouth daily. 30 tablet 0  . Ferrous Fumarate (HEMOCYTE - 106 MG FE) 324 (106 Fe) MG TABS tablet Take 1 tablet (106 mg of iron total) by mouth daily. 30 tablet 3  . lisdexamfetamine (VYVANSE) 60 MG capsule Take 1 capsule (60 mg total) by mouth every morning. 30 capsule 0  . pantoprazole (PROTONIX) 40 MG tablet Take 1 tablet (40 mg total) by mouth daily. 30 tablet 3  . propranolol (INDERAL) 20 MG tablet Take 20 mg by mouth as needed.     No current facility-administered medications on file prior to visit.    Allergies  Allergen Reactions  . Mango Flavor   .  Red Dye   . Shellfish Allergy     Social History: Confidentiality was discussed with the patient and if applicable, with caregiver as well. Tobacco: no Secondhand smoke exposure? yes  Drugs/EtOH: MJ a few times a week, ETOH a few times a week, about once a month to illness or blackout  Sexually active? yes - one female partner  Safety: safe at home  Last STI Screening: 2021  Pregnancy Prevention: condoms    Physical Exam:    Vitals:   03/11/20 1121  BP: 117/81  Pulse: 70  Weight: 172 lb (78 kg)  Height: 5' 2.6" (1.59 m)    Growth percentile SmartLinks can only be used for patients less than 85 years old.  Physical Exam Vitals and nursing note reviewed.  Constitutional:      General: She is not in acute distress.    Appearance: She is well-developed.  Neck:     Thyroid: No thyromegaly.  Cardiovascular:     Rate and Rhythm: Normal rate and regular rhythm.     Heart sounds: No murmur heard.   Pulmonary:     Breath sounds: Normal breath sounds.  Abdominal:     Palpations: Abdomen is soft. There is no mass.     Tenderness: There is no abdominal tenderness. There is no guarding.  Musculoskeletal:     Right lower leg: No edema.     Left lower leg: No edema.  Lymphadenopathy:     Cervical: No cervical adenopathy.  Skin:    General: Skin is warm.     Capillary Refill: Capillary refill takes less than 2 seconds.     Findings: No rash.  Neurological:     Mental Status: She is alert.     Comments: No tremor  Psychiatric:        Mood and Affect: Mood is anxious.        Thought Content: Thought content does not include homicidal or suicidal ideation. Thought content does not include suicidal plan.     Assessment/Plan: 1. Attention deficit hyperactivity disorder (ADHD), combined type Continue vyvanse and adderall.   2. Anorexia nervosa, binge eating/purging type Has not had any purging, but is still eating overall very little. We discussed some strategies and risks of not getting enough intake. She is still precontemplative/contemplative about making changes at this point.  - escitalopram (LEXAPRO) 10 MG tablet; Take 1 tablet (10 mg total) by mouth daily.  Dispense: 30 tablet; Refill: 2  3. Adjustment disorder with mixed anxiety and depressed mood Will try lexapro 10 mg and monitor closely.  - escitalopram (LEXAPRO) 10 MG tablet; Take 1 tablet (10 mg total) by mouth daily.   Dispense: 30 tablet; Refill: 2  4. Sleep disturbance Improved currently.   5. Slow transit constipation Needs to be taking miralax daily. We discussed this again.   6. Oral contraception initial prescription Agreeable to try OCP. I have sent to the pharmacy. Discussed back up method for at least 7 days.  - norethindrone-ethinyl estradiol (JUNEL FE 1/20) 1-20 MG-MCG tablet; Take 1 tablet by mouth daily.  Dispense: 84 tablet; Refill: 3  7. Pregnancy examination or test, negative result Neg today. Will repeat at next visit.  - POCT urine pregnancy   Return in 2 weeks   Jonathon Resides, FNP

## 2020-03-15 ENCOUNTER — Encounter: Payer: Self-pay | Admitting: Pediatrics

## 2020-03-15 LAB — POCT URINE PREGNANCY: Preg Test, Ur: NEGATIVE

## 2020-03-25 ENCOUNTER — Ambulatory Visit: Payer: No Typology Code available for payment source | Admitting: Pediatrics

## 2020-03-25 ENCOUNTER — Ambulatory Visit: Payer: No Typology Code available for payment source | Admitting: Registered"

## 2020-03-29 ENCOUNTER — Other Ambulatory Visit: Payer: Self-pay

## 2020-03-29 DIAGNOSIS — F902 Attention-deficit hyperactivity disorder, combined type: Secondary | ICD-10-CM

## 2020-03-30 MED ORDER — AMPHETAMINE-DEXTROAMPHETAMINE 15 MG PO TABS: 15.0000 mg | ORAL_TABLET | Freq: Every day | ORAL | 0 refills | Status: DC

## 2020-03-30 MED ORDER — LISDEXAMFETAMINE DIMESYLATE 60 MG PO CAPS: 60.0000 mg | ORAL_CAPSULE | ORAL | 0 refills | Status: DC

## 2020-04-01 ENCOUNTER — Other Ambulatory Visit: Payer: Self-pay

## 2020-04-01 ENCOUNTER — Encounter: Payer: Self-pay | Admitting: Registered"

## 2020-04-01 ENCOUNTER — Encounter: Payer: No Typology Code available for payment source | Attending: Pediatrics | Admitting: Registered"

## 2020-04-01 DIAGNOSIS — F5002 Anorexia nervosa, binge eating/purging type: Secondary | ICD-10-CM | POA: Insufficient documentation

## 2020-04-01 DIAGNOSIS — Z713 Dietary counseling and surveillance: Secondary | ICD-10-CM | POA: Diagnosis not present

## 2020-04-01 NOTE — Progress Notes (Signed)
Appointment start time: 10:18  Appointment end time: 10:49  Patient was seen on 04/01/2020 for nutrition counseling pertaining to disordered eating  Primary care provider: Jonathon Resides, FNP Therapist: Vassie Moselle (sees weekly)  ROI: 03/02/2020 Any other medical team members: adolescent medicine Parents: none   Assessment  States things are going fine. States dizziness has improved.  States therapy is annoying and she only comes to appts because she has to. States she will be going home for fall break to see her dogs.   Denies weighing at home. States she cannot recall the last time she purged. Reports vomiting a few weeks ago as a result of drinking alcohol. States she didn't vomit intentionally but was glad it happened.   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    Eating history: Length of time: 7 years, age 14 Previous treatments: no Goals for RD meetings: improve constipation, dizziness/lightheadedness, headaches  Weight history:  Highest weight:    Lowest weight:  Most consistent weight:   What would you like to weigh: N/A How has weight changed in the past year: up and down  Medical Information:  Changes in hair, skin, nails since ED started: hair loss, nails easily breaking, easily bruises Chewing/swallowing difficulties: no Reflux or heartburn: yes, taking meds Trouble with teeth: yes, recently tooth cracked LMP without the use of hormones: 9/6 Effect of exercise on menses: no    Constipation, diarrhea: yes, constipated has BM 1-2x/week; supposed to be taking Miralax and doesn't take it. Doesn't like the taste Dizziness/lightheadedness: yes; when standing up too fast Headaches/body aches: yes, daily Heart racing/chest pain: no Mood: fine Sleep: bad; sleeps 3-8  hrs/night; usually like 4-5 hrs Focus/concentration: yes, challenges. Has ADHD Cold intolerance: yes Vision changes: states she should be wearing glasses but doesn't wear them. Does not like that they become foggy when wearing mask.   Mental health diagnosis: AN, binge purge type   Dietary assessment: A typical day consists of 1 meals and 3-4 snacks  Safe foods include: pickles, strawberries, grilled chicken wrap, protein bar, carrots, cucumbers, almond thins, [place], apples, almond milk yogurt, cheerios, toast Avoided foods include: all other items  24 hour recall:  B: toast  S: strawberries L: Quest protein bar + bowl of carrots S:  D (7 pm): 1/2 [employer]er Joe[employer] steam bag rice, shredded cucumber, chickpeas (in a bowl) S Beverages: water (32 oz)  Physical activity: none reported  What Methods Do You Use To Control Your Weight (Compensatory behaviors)?           Restricting   SIV  Exercise - hula hoop  Food rules or rituals - doesn't want to talk about it  Binge - not anymore  Estimated energy intake: [phone] kcal  Estimated energy needs: 2000-2200 kcal 250-275 g CHO 125-138 g pro 56-61 g fat  Nutrition Diagnosis: NB-1.5 Disordered eating pattern As related to harmful beliefs about eating.  As evidenced by restriction of food.  Intervention/Goals: Pt was educated and counseled on eating to nourish the body and ways to increase nourishment. Encouraged pt with changes/progress made thus far. Pt was in agreement with goals listed. Goals: - Try to have yogurt with fruit.  - Continue to keep up the great work eating more than once a day and with others.  Meal plan:    1-2 meals    1-2 snacks  Monitoring and Evaluation: Patient will follow up in 1 week.

## 2020-04-01 NOTE — Patient Instructions (Signed)
-   Try to have yogurt with fruit.   - Continue to keep up the great work eating more than once a day and with others.

## 2020-04-08 ENCOUNTER — Other Ambulatory Visit: Payer: Self-pay

## 2020-04-08 ENCOUNTER — Encounter: Payer: No Typology Code available for payment source | Attending: Pediatrics | Admitting: Registered"

## 2020-04-08 ENCOUNTER — Encounter: Payer: Self-pay | Admitting: Registered"

## 2020-04-08 ENCOUNTER — Ambulatory Visit (INDEPENDENT_AMBULATORY_CARE_PROVIDER_SITE_OTHER): Payer: No Typology Code available for payment source | Admitting: Pediatrics

## 2020-04-08 VITALS — BP 142/100 | HR 76 | Ht 62.21 in | Wt 169.6 lb

## 2020-04-08 DIAGNOSIS — Z1389 Encounter for screening for other disorder: Secondary | ICD-10-CM | POA: Diagnosis not present

## 2020-04-08 DIAGNOSIS — F4323 Adjustment disorder with mixed anxiety and depressed mood: Secondary | ICD-10-CM

## 2020-04-08 DIAGNOSIS — F5002 Anorexia nervosa, binge eating/purging type: Secondary | ICD-10-CM | POA: Diagnosis not present

## 2020-04-08 DIAGNOSIS — I158 Other secondary hypertension: Secondary | ICD-10-CM

## 2020-04-08 DIAGNOSIS — F902 Attention-deficit hyperactivity disorder, combined type: Secondary | ICD-10-CM | POA: Diagnosis not present

## 2020-04-08 DIAGNOSIS — R030 Elevated blood-pressure reading, without diagnosis of hypertension: Secondary | ICD-10-CM

## 2020-04-08 DIAGNOSIS — G479 Sleep disorder, unspecified: Secondary | ICD-10-CM | POA: Diagnosis not present

## 2020-04-08 DIAGNOSIS — Z713 Dietary counseling and surveillance: Secondary | ICD-10-CM | POA: Insufficient documentation

## 2020-04-08 LAB — POCT URINALYSIS DIPSTICK
Bilirubin, UA: NEGATIVE
Glucose, UA: NEGATIVE
Leukocytes, UA: NEGATIVE
Nitrite, UA: NEGATIVE
Protein, UA: POSITIVE — AB
Spec Grav, UA: 1.01 (ref 1.010–1.025)
Urobilinogen, UA: NEGATIVE E.U./dL — AB
pH, UA: 5 (ref 5.0–8.0)

## 2020-04-08 MED ORDER — ESCITALOPRAM OXALATE 20 MG PO TABS: 20.0000 mg | ORAL_TABLET | Freq: Every day | ORAL | 3 refills | Status: DC

## 2020-04-08 NOTE — Patient Instructions (Addendum)
Increase lexapro to 15 mg x 1 week. If this goes well, increase to 20 mg daily  Decrease caffeine use  We will see you in 2 weeks

## 2020-04-08 NOTE — Patient Instructions (Signed)
-   Take snacks to work daily such as granola bar, protein bar, fruit + nuts, etc to have while there. Snacks should include 2 food groups.   - Aim to have breakfast daily: PB toast + strawberries.

## 2020-04-08 NOTE — Progress Notes (Signed)
Appointment start time: 10:12  Appointment end time: 10:55  Patient was seen on 04/08/2020 for nutrition counseling pertaining to disordered eating  Primary care provider: Jonathon Resides, FNP Therapist: Vassie Moselle (no longer seeing; was seeing weekly), Craige Cotta (sees weekly)  ROI: 03/02/2020 Any other medical team members: adolescent medicine Parents: none   Assessment  States she is no longer seeing [place] as therapist. States they "didn't vibe". 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 States she notices hunger while she is at work but does not take snacks with her. States once she leaves work she is no longer hunger.   Reports recent breakup and has been emotional in recent weeks. States she has moved on and has "new friends" now. Reports hickies on neck are from "new friends".   States fall break is coming up and going to stay at [employer]. States she was at mom's last weekend as well and felt she ate more because she was sad. Ate things she normally wouldn't eat: macaroon, fruit bar, dinner mom cooked (spinach stuffed chicken, roasted potatoes, roasted broccoli), roast, carrots, and green beans. States she told herself it was ok to have these things because she doesn't normally eat them.   Previous visit: Denies weighing at home. States she cannot recall the last time she purged.   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    Eating history: Length of time: 7 years, since age 9 Previous treatments: no Goals for RD meetings: improve constipation, dizziness/lightheadedness, headaches  Weight history:  Highest weight:    Lowest weight:  Most consistent weight:   What would you like to weigh: N/A How has weight  changed in the past year: up and down  Medical Information:  Changes in hair, skin, nails since ED started: hair loss, nails easily breaking, easily bruises Chewing/swallowing difficulties: no Reflux or heartburn: yes, taking meds Trouble with teeth: yes, recently tooth cracked LMP without the use of hormones: 9/6 Effect of exercise on menses: no    Constipation, diarrhea: yes, constipated has BM 1-2x/week; supposed to be taking Miralax and doesn't take it. Doesn't like the taste Dizziness/lightheadedness: yes; when standing up too fast Headaches/body aches: yes, daily Heart racing/chest pain: no Mood: fine Sleep: bad; sleeps 3-8 hrs/night; usually like 4-5 hrs Focus/concentration: yes, challenges. Has ADHD Cold intolerance: yes Vision changes: states she should be wearing glasses but doesn't wear them. Does not like that they become foggy when wearing mask.   Mental health diagnosis: AN, binge purge type   Dietary assessment: A typical day consists of 1 meals and 3-4 snacks  Safe foods include: pickles, strawberries, grilled chicken wrap, protein bar, carrots, cucumbers, almond thins, [place], apples, almond milk yogurt, cheerios, toast  Avoided foods include: all other items  24 hour recall:  B: Quest protein bar  S: apple + yogurt  L:    S:  D (7 pm): mixed vegetables S: 1/2 bag of popcorn  Beverages: water (32 oz)  Physical activity: none reported  What Methods Do You Use To Control Your Weight (Compensatory behaviors)?           Restricting   SIV  Exercise - hula hoop  Food rules or rituals - doesn't  want to talk about it  Binge - not anymore  Estimated energy intake: 600-700 kcal  Estimated energy needs: 2000-2200 kcal 250-275 g CHO 125-138 g pro 56-61 g fat  Nutrition Diagnosis: NB-1.5 Disordered eating pattern As related to harmful beliefs about eating.  As evidenced by restriction of food.  Intervention/Goals: Pt was educated and counseled on eating to  nourish the body and ways to increase nourishment. Discussed ways to incorporate afternoon snack while at work. Discussed ways to have breakfast as well. Pt was in agreement with goals listed. Goals: - Take snacks to work daily such as granola bar, protein bar, fruit + nuts, etc to have while there. Snacks should include 2 food groups.  - Aim to have breakfast daily: PB toast + strawberries.  Meal plan:    1-2 meals    1-2 snacks  Monitoring and Evaluation: Patient will follow up in 2 weeks.
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
## 2020-04-14 NOTE — Progress Notes (Signed)
I have reviewed the resident's note and plan of care and helped develop the plan as necessary.  Discussed undernutrition as likely cause of fatigue as well as poor sleep hygiene. Discussed risks of significant caffeine use with stimulant- we will not be bale to continue to rx for stimulants if ongoing caffeine abuse.   Will work on connection to therapist- reports she has plans to see another.   Will would benefit from [place], but has been resistant to this in the past. Will continue outpatient for now but if ongoing difficulty will push a little harder for HLOC.   Jonathon Resides, FNP

## 2020-04-20 ENCOUNTER — Encounter: Payer: No Typology Code available for payment source | Admitting: Registered"

## 2020-04-20 ENCOUNTER — Other Ambulatory Visit: Payer: Self-pay

## 2020-04-20 ENCOUNTER — Encounter: Payer: Self-pay | Admitting: Registered"

## 2020-04-20 DIAGNOSIS — F5002 Anorexia nervosa, binge eating/purging type: Secondary | ICD-10-CM | POA: Diagnosis not present

## 2020-04-20 DIAGNOSIS — Z713 Dietary counseling and surveillance: Secondary | ICD-10-CM | POA: Diagnosis not present

## 2020-04-20 NOTE — Patient Instructions (Signed)
-   Have grilled chicken wrap (chicken, vegetables, tortilla) + fruit once a day.   - Aim to have breakfast daily: PB toast + strawberries.

## 2020-04-20 NOTE — Progress Notes (Signed)
Appointment start time: 3:08 Appointment end time: 3:30  Patient was seen on 04/20/2020 for nutrition counseling pertaining to disordered eating  Primary care provider: Jonathon Resides, FNP Therapist: Craige Cotta (sees weekly; virtually)  ROI: 03/02/2020 Any other medical team members: adolescent medicine Parents: none   Assessment  States she ate soup when at mom's for fall break. Denies purging since previous visit. States she does not feel hungry and doesn't eat when not feeling hungry.   Previous visit: Denies weighing at home. States she cannot recall the last time she purged.   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    Eating history: Length of time: 7 years, since age 12 Previous treatments: no Goals for RD meetings: improve constipation, dizziness/lightheadedness, headaches  Weight history:  Highest weight:    Lowest weight:  Most consistent weight:   What would you like to weigh: N/A How has weight changed in the past year: up and down  Medical Information:  Changes in hair, skin, nails since ED started: hair loss, nails easily breaking, easily bruises Chewing/swallowing difficulties: no Reflux or heartburn: yes, taking meds Trouble with teeth: yes, recently tooth cracked LMP without the use of hormones: 9/6 Effect of exercise on menses: no    Constipation, diarrhea: yes, constipated has BM 1-2x/week; supposed to be taking Miralax and doesn't take it. Doesn't like the taste Dizziness/lightheadedness: no Headaches/body aches: yes, daily Heart racing/chest pain: no Mood: fine Sleep: bad; sleeps 3-8 hrs/night; usually like 4-5 hrs Focus/concentration: yes, challenges. Has ADHD Cold intolerance: yes Vision changes: states she should be wearing glasses but  doesn't wear them. Does not like that they become foggy when wearing mask.   Mental health diagnosis: AN, binge purge type   Dietary assessment: A typical day consists of 1 meals and 3-4 snacks  Safe foods include: pickles, strawberries, grilled chicken wrap, protein bar, carrots, cucumbers, almond thins, [place], apples, almond milk yogurt, cheerios, toast  Avoided foods include: all other items  24 hour recall:  B: rice cake + hummus + cucumber or [employer] protein bar  S:  L:  Protein bar S: 3 frozen chocolate raspberries D (7 pm): [employer] - 3 bowls of vegetable soup + breadstick + water S: Beverages: water (1.5*32 oz; 48 oz)  Physical activity: none reported  What Methods Do You Use To Control Your Weight (Compensatory behaviors)?           Restricting   SIV  Exercise - hula hoop  Food rules or rituals - doesn't want to talk about it  Binge - not anymore  Estimated energy intake: 1200-1300 kcal  Estimated energy needs: 2000-2200 kcal 250-275 g CHO 125-138 g pro 56-61 g fat  Nutrition Diagnosis: NB-1.5 Disordered eating pattern As related to harmful beliefs about eating.  As evidenced by restriction of food.  Intervention/Goals: Pt was educated and counseled on eating to nourish the body and ways to increase nourishment.  Discussed ways to have breakfast. Pt was in agreement with goals listed. Goals: - Have grilled chicken wrap (chicken, vegetables, tortilla) + fruit once a day.  - Aim to have breakfast daily: PB toast + strawberries.   Meal plan:    1-2 meals    1-2 snacks  Monitoring and Evaluation: Patient will follow up in 1  week.

## 2020-04-20 NOTE — Addendum Note (Signed)
Addended by: Lenore Cordia F on: 04/20/2020 01:52 PM   Modules accepted: Level of Service

## 2020-04-22 ENCOUNTER — Ambulatory Visit (INDEPENDENT_AMBULATORY_CARE_PROVIDER_SITE_OTHER): Payer: No Typology Code available for payment source | Admitting: Pediatrics

## 2020-04-22 ENCOUNTER — Encounter: Payer: Self-pay | Admitting: Pediatrics

## 2020-04-22 VITALS — BP 105/71 | HR 70 | Ht 62.21 in | Wt 173.4 lb

## 2020-04-22 DIAGNOSIS — F5002 Anorexia nervosa, binge eating/purging type: Secondary | ICD-10-CM | POA: Diagnosis not present

## 2020-04-22 DIAGNOSIS — F4323 Adjustment disorder with mixed anxiety and depressed mood: Secondary | ICD-10-CM | POA: Diagnosis not present

## 2020-04-22 DIAGNOSIS — Z3202 Encounter for pregnancy test, result negative: Secondary | ICD-10-CM | POA: Diagnosis not present

## 2020-04-22 DIAGNOSIS — F902 Attention-deficit hyperactivity disorder, combined type: Secondary | ICD-10-CM

## 2020-04-22 LAB — POCT URINE PREGNANCY: Preg Test, Ur: NEGATIVE

## 2020-04-22 MED ORDER — AMPHETAMINE-DEXTROAMPHETAMINE 15 MG PO TABS: 15.0000 mg | ORAL_TABLET | Freq: Every day | ORAL | 0 refills | Status: DC

## 2020-04-22 MED ORDER — LISDEXAMFETAMINE DIMESYLATE 60 MG PO CAPS: 60.0000 mg | ORAL_CAPSULE | ORAL | 0 refills | Status: DC

## 2020-04-22 NOTE — Progress Notes (Signed)
This note is not being shared with the patient for the following reason: To respect privacy (The patient or proxy has requested that the information not be shared).  THIS RECORD MAY CONTAIN CONFIDENTIAL INFORMATION THAT SHOULD NOT BE RELEASED WITHOUT REVIEW OF THE SERVICE PROVIDER.  Adolescent Medicine Consultation Follow-Up Visit Regina Wilkerson  is a 24 y.o. female referred by Trude Mcburney, FNP here today for follow-up regarding anorexia nervosa binge eating/purging type and adjustment disorder.     History was provided by the patient.  Interpreter? no  HPI:   PCP Confirmed?  yes  My Chart Activated?   yes   HPI:    Goals for the visit: follow up regarding medication change (increased lexapro to 20 mg)  Concerns from home: none  24 hr meal recall: B: protein bar, raspberries L: tortilla chips D: tofu, rice, noodles, vegetables  Meals: continues to endorse some restriction, though not as much as she was doing before Water intake: Two ~24 fluid oz water bottles a day Dietitian: Followed by Clementeen Hoof Therapist: Shelby - Works in [place] at [employer]; weekly virtual visits have been going well so far Medication: Lexapro 20 mg, vyvanse 60 mg, adderall 15 mg  - Compliance: 100% - Side effects: None - Benefits: mood has been improved; she has made a new friend that she has been "healing with" and the friend has had a positive influence on her Activity level: hula hoops often, occasionally does Zumba with friend School: [employer] Binge/purge: None, though still has some restricting behaviors Caffeine intake: haven't had energy drinks and coffee for a week LMP: not sure if she had one the last week of September; was spotting throughout last month due to taking Plan Bs Menstrual cycles: usually occur monthly and typically last about 5-7 days  - currently on OCPs and reports taking them daily   Review of systems:  Headaches: Y; related to inconsistent use of  glasses and jaw clenching Dizziness: Y; typically occurs when she stands up too fast Abdominal pain: N Nausea/vomiting: yes to nausea; no to vomiting. Nausea seems to be associated with most meals Dysphagia: N Odonophagia: N Constipation: Y; has been using miralax inconsistently Diarrhea: N Tooth decay: N Reflux: Y but not as bad as it used to be Heart palpitations: Sometimes feels like her heart is racing Heat/cold intolerance: Feels cold sometimes Skin changes: Dryness and itchiness around left eye that was red but is not mostly gone; similar rash on chin Hair loss: N  Mood/anxiety: N   No LMP recorded. Allergies  Allergen Reactions  . Mango Flavor   . Red Dye   . Shellfish Allergy    Current Outpatient Medications on File Prior to Visit  Medication Sig Dispense Refill  . escitalopram (LEXAPRO) 20 MG tablet Take 1 tablet (20 mg total) by mouth daily. 30 tablet 3  . Ferrous Fumarate (HEMOCYTE - 106 MG FE) 324 (106 Fe) MG TABS tablet Take 1 tablet (106 mg of iron total) by mouth daily. 30 tablet 3  . norethindrone-ethinyl estradiol (JUNEL FE 1/20) 1-20 MG-MCG tablet Take 1 tablet by mouth daily. 84 tablet 3  . pantoprazole (PROTONIX) 40 MG tablet Take 1 tablet (40 mg total) by mouth daily. 30 tablet 3  . propranolol (INDERAL) 20 MG tablet Take 20 mg by mouth as needed.     No current facility-administered medications on file prior to visit.    Patient Active Problem List   Diagnosis Date Noted  . Elevated blood-pressure  reading without diagnosis of hypertension 04/08/2020  . Gastroesophageal reflux disease 01/07/2020  . Anorexia nervosa, binge eating/purging type 10/15/2019  . Slow transit constipation 10/15/2019  . Adjustment disorder with mixed anxiety and depressed mood 10/15/2019  . Sleep disturbance 10/15/2019  . Chronic nonintractable headache 10/15/2019  . Attention deficit hyperactivity disorder (ADHD), combined type 10/15/2019    Lifestyle habits that can  impact QOL: Sleep: 12AM - 6:30 AM; having trouble going to sleep and going back to sleep once awake Body Movement: hula hoop, Zumba  Confidentiality was discussed with the patient and if applicable, with caregiver as well.  Changes at home or school since last visit:  no  Tobacco? Dap pen (a few times a week), Marijuana every day this past week Drugs/ETOH? Alcohol use 1-2 times a week, usually at parties Partner preference?  both but most recently she has preferred males Sexually Active?  Yes; uses condoms sometimes  Suicidal or homicidal thoughts?   no Self injurious behaviors?  no  Physical Exam:  Vitals:   04/22/20 1414 04/22/20 1419  BP: 105/71   Pulse: (!) 59 70  Weight: 173 lb 6.4 oz (78.7 kg)   Height: 5' 2.21" (1.58 m)    BP 105/71   Pulse 70   Ht 5' 2.21" (1.58 m)   Wt 173 lb 6.4 oz (78.7 kg)   BMI 31.51 kg/m  Body mass index: body mass index is 31.51 kg/m. Growth percentile SmartLinks can only be used for patients less than 18 years old.   Physical Exam Vitals reviewed.  Constitutional:      General: She is not in acute distress.    Appearance: Normal appearance. She is not ill-appearing.  HENT:     Head: Normocephalic and atraumatic.     Right Ear: External ear normal.     Left Ear: External ear normal.     Mouth/Throat:     Mouth: Mucous membranes are moist.     Pharynx: Oropharynx is clear.  Eyes:     Extraocular Movements: Extraocular movements intact.     Conjunctiva/sclera: Conjunctivae normal.     Pupils: Pupils are equal, round, and reactive to light.  Cardiovascular:     Rate and Rhythm: Normal rate and regular rhythm.     Pulses: Normal pulses.  Pulmonary:     Effort: Pulmonary effort is normal.     Breath sounds: Normal breath sounds.  Abdominal:     General: Abdomen is flat. Bowel sounds are normal.     Palpations: Abdomen is soft.  Musculoskeletal:        General: Normal range of motion.     Cervical back: Normal range of motion and  neck supple. No rigidity or tenderness.  Lymphadenopathy:     Cervical: No cervical adenopathy.  Skin:    General: Skin is warm and dry.     Capillary Refill: Capillary refill takes less than 2 seconds.  Neurological:     General: No focal deficit present.     Mental Status: She is alert and oriented to person, place, and time. Mental status is at baseline.  Psychiatric:        Attention and Perception: Attention normal.        Mood and Affect: Mood normal. Affect is blunt and inappropriate.        Speech: Speech normal.        Behavior: Behavior is cooperative.        Thought Content: Thought content normal.  Cognition and Memory: Cognition and memory normal.        Judgment: Judgment normal.     Assessment/Plan:  1. Attention deficit hyperactivity disorder (ADHD), combined type - Continue vyvanse and adderall. Will reorder Rx today to prevent her from running out of medication.  2. Anorexia nervosa, binge eating/purging type Gained four pounds since her last visit on 10/7. Continue following up with the dietitian on a consistent basis; next dietitian appointment is next week on 10/28.   3. Adjustment disorder with mixed anxiety and depressed mood Increased dose of lexapro seems to be helping with her mood, along with her new friendship with her friend. She has also found a new therapist who has been beneficial to her mental health. Continue the current dose of lexapro (20 mg). Will follow up in about four weeks to check in on mood at that time.  4. Oral contraception Pregnancy test was negative today. -Continue Junel     BH screenings:  PHQ-SADS Last 3 Score only 03/02/2020 10/13/2019  PHQ-15 Score - 16  Total GAD-7 Score - 16  PHQ-9 Total Score 17 12    Screens performed during this visit were discussed with patient and parent and adjustments to plan made accordingly.   Follow-up:  No follow-ups on file.   Medical decision-making:  >30 minutes spent face to  face with patient with more than 50% of appointment spent discussing diagnosis, management, follow-up.   Georgeanne Nim, MD Pediatrics, PGY-3

## 2020-04-22 NOTE — Patient Instructions (Signed)
Continue lexapro 20 mg daily

## 2020-04-25 NOTE — Progress Notes (Signed)
Continues with an odd affect that is typical for her, but overall reports improvement in mood and is consistently taking her medications. Will continue to monitor.   Jonathon Resides, FNP

## 2020-04-28 ENCOUNTER — Other Ambulatory Visit: Payer: Self-pay | Admitting: Pediatrics

## 2020-04-28 ENCOUNTER — Other Ambulatory Visit: Payer: Self-pay

## 2020-04-28 DIAGNOSIS — F902 Attention-deficit hyperactivity disorder, combined type: Secondary | ICD-10-CM

## 2020-04-28 DIAGNOSIS — Z30011 Encounter for initial prescription of contraceptive pills: Secondary | ICD-10-CM

## 2020-04-29 ENCOUNTER — Encounter: Payer: Self-pay | Admitting: Registered"

## 2020-04-29 ENCOUNTER — Encounter: Payer: No Typology Code available for payment source | Admitting: Registered"

## 2020-04-29 ENCOUNTER — Other Ambulatory Visit: Payer: Self-pay

## 2020-04-29 DIAGNOSIS — Z713 Dietary counseling and surveillance: Secondary | ICD-10-CM

## 2020-04-29 DIAGNOSIS — F5002 Anorexia nervosa, binge eating/purging type: Secondary | ICD-10-CM | POA: Diagnosis not present

## 2020-04-29 NOTE — Patient Instructions (Addendum)
-   Have PB toast + strawberries as first time of eating every other day.

## 2020-04-29 NOTE — Progress Notes (Signed)
Appointment start time: 2:12 Appointment end time: 2:43  Patient was seen on 04/29/2020 for nutrition counseling pertaining to disordered eating  Primary care provider: Jonathon Resides, FNP Therapist: Craige Cotta (sees weekly; virtually)  ROI: 03/02/2020 Any other medical team members: adolescent medicine Parents: none   Assessment  States eating is about the same as always. Reports not working on having breakfast. States she will grab something like every other day for breakfast. States she will have chicken wrap + fruit sometimes. States she has not been feeling well the last few days; states it may be a cold or allergies. Reports negative COVID test. States she is taking sinus medications. States juice makes her stomach burn; does not drink it.  Previous visit: States she does not feel hungry and doesn't eat when not feeling hungry. Denies weighing at home. States she cannot recall the last time she purged.   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    Eating history: Length of time: 7 years, since age 62 Previous treatments: no Goals for RD meetings: improve constipation, dizziness/lightheadedness, headaches  Weight history:  Highest weight:    Lowest weight:  Most consistent weight:   What would you like to weigh: N/A How has weight changed in the past year: up and down  Medical Information:  Changes in hair, skin, nails since ED started: hair loss, nails easily breaking, easily bruises Chewing/swallowing difficulties: no Reflux or heartburn: yes, taking meds Trouble with teeth: yes, recently tooth cracked LMP without the use of hormones: 9/6 Effect of exercise on menses: no    Constipation, diarrhea: yes, constipated has BM 1-2x/week; supposed to be taking Miralax and  doesn't take it. Doesn't like the taste Dizziness/lightheadedness: no Headaches/body aches: yes, daily Heart racing/chest pain: no Mood: fine Sleep: bad; sleeps 3-8 hrs/night; usually like 4-5 hrs Focus/concentration: yes, challenges. Has ADHD Cold intolerance: yes Vision changes: states she should be wearing glasses but doesn't wear them. Does not like that they become foggy when wearing mask.   Mental health diagnosis: AN, binge purge type   Dietary assessment: A typical day consists of 1 meals and 3-4 snacks  Safe foods include: pickles, strawberries, grilled chicken wrap, protein bar, carrots, cucumbers, almond thins, [place], apples, almond milk yogurt, cheerios, toast  Avoided foods include: all other items  24 hour recall:  B: handful of dry cheerios  S:  L: 1/2 bag of popcorn (burnt the other half bag) S: [employer] - ice cream cup D (7 pm): 1.5 c white rice S: Beverages: water (1.5*32 oz; 48 oz), juice  Physical activity: none reported  What Methods Do You Use To Control Your Weight (Compensatory behaviors)?           Restricting   SIV  Exercise - hula hoop  Food rules or rituals - doesn't want to talk about it  Binge - not anymore  Estimated energy intake: 600-700 kcal  Estimated energy needs: 2000-2200 kcal 250-275 g CHO 125-138 g pro 56-61 g fat  Nutrition Diagnosis: NB-1.5 Disordered eating pattern As related to harmful beliefs about eating.  As evidenced by restriction of food.  Intervention/Goals: Pt was educated and counseled on eating to nourish the body and ways to increase nourishment.  Discussed ways to have breakfast. Pt was in agreement with  goals listed. Goals: - Have PB toast + strawberries as first time of eating every other day.     Meal plan:    1-2 meals    1-2 snacks  Monitoring and Evaluation: Patient will follow up in 2 weeks.

## 2020-05-11 ENCOUNTER — Encounter: Payer: Self-pay | Admitting: Registered"

## 2020-05-11 ENCOUNTER — Encounter: Payer: No Typology Code available for payment source | Attending: Pediatrics | Admitting: Registered"

## 2020-05-11 ENCOUNTER — Other Ambulatory Visit: Payer: Self-pay

## 2020-05-11 DIAGNOSIS — F5002 Anorexia nervosa, binge eating/purging type: Secondary | ICD-10-CM | POA: Insufficient documentation

## 2020-05-11 DIAGNOSIS — Z713 Dietary counseling and surveillance: Secondary | ICD-10-CM | POA: Insufficient documentation

## 2020-05-11 NOTE — Progress Notes (Signed)
Appointment start time: 11:53 Appointment end time: 12:18  Patient was seen on 05/11/2020 for nutrition counseling pertaining to disordered eating  Primary care provider: Jonathon Resides, FNP Therapist: Craige Cotta (sees weekly; virtually)  ROI: 03/02/2020 Any other medical team members: adolescent medicine Parents: none   Assessment  Arrives stating she is having a challenging time in school. Still seeing therapist weekly. Arrives having a lost voice. States she thought about making an appt with healthcare provider but appt would be next week and thinks her sickness will be gone by then so just plans to "ride it out". States she feels like she has pneumonia. Reports having 3 negative COVID tests. [facility] center on campus informed her it may be a cold. States during Halloween weekend vomited multiple times after being hung over for 3 days. Doesn't like to feel nauseous. Reports she took melatonin last night and benadryl to make herself go to sleep after eating dinner because she felt like she would vomit because she kept coughing. States they threw away [place] leftovers from last night. States she was having nose bleeds.   States she went home this weekend. Mom made soup for her.   States she didn't eat PB toast for breakfast because its "too think" but had cheerios + fruit on some days.   Previous visit: States she does not feel hungry and doesn't eat when not feeling hungry. Denies weighing at home. States she cannot recall the last time she purged.   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    Eating history: Length of time: 7 years, since age 39 Previous treatments: no Goals for RD meetings: improve constipation, dizziness/lightheadedness, headaches  Weight history:   Highest weight:    Lowest weight:  Most consistent weight:   What would you like to weigh: N/A How has weight changed in the past year: up and down  Medical Information:  Changes in hair, skin, nails since ED started: hair loss, nails easily breaking, easily bruises Chewing/swallowing difficulties: no Reflux or heartburn: yes, taking meds Trouble with teeth: yes, recently tooth cracked LMP without the use of hormones: 10/31 Effect of exercise on menses: no    Constipation, diarrhea: yes, constipated has BM 1-2x/week; supposed to be taking Miralax and doesn't take it. Doesn't like the taste Dizziness/lightheadedness: no Headaches/body aches: yes, daily Heart racing/chest pain: no Mood: fine Sleep: bad; sleeps 3-8 hrs/night; usually like 4-5 hrs Focus/concentration: yes, challenges. Has ADHD Cold intolerance: yes Vision changes: states she should be wearing glasses but doesn't wear them. Does not like that they become foggy when wearing mask.   Mental health diagnosis: AN, binge purge type   Dietary assessment: A typical day consists of 1-2 meals and 3-4 snacks  Safe foods include: pickles, strawberries, grilled chicken wrap, protein bar, carrots, cucumbers, almond thins, [place], apples, almond milk yogurt, cheerios, toast  Avoided foods include: all other items  24 hour recall:  B: sometimes cheerios +  almond milk + strawberries S: cheese stick + a few cashews L: protein bar + [employer] peppermint mocha with oatmilk S: D (7 pm): plate full of vegetable lo mein + tofu + broccoli + rice  S: Beverages: water (1.5*32 oz; 48 oz), latte, Coke (6 oz)  Physical activity: none reported  What Methods Do  You Use To Control Your Weight (Compensatory behaviors)?           Restricting   SIV  Exercise - hula hoop  Food rules or rituals - doesn't want to talk about it  Binge - not anymore  Estimated energy intake: 1200-1300 kcal  Estimated energy needs: 2000-2200 kcal 250-275 g  CHO 125-138 g pro 56-61 g fat  Nutrition Diagnosis: NB-1.5 Disordered eating pattern As related to harmful beliefs about eating.  As evidenced by restriction of food.  Intervention/Goals: Pt was educated and counseled on eating to nourish the body and ways to increase nourishment.  Discussed ways to have breakfast. Pt was in agreement with goals listed. Goals: - Continue to have meal in the morning: PB toast + strawberries or yogurt + fruit + cereal   Meal plan:    1-2 meals    1-2 snacks  Monitoring and Evaluation: Patient will follow up in 2 weeks.

## 2020-05-11 NOTE — Patient Instructions (Signed)
-   Continue to have meal in the morning: PB toast + strawberries or yogurt + fruit + cereal

## 2020-05-20 ENCOUNTER — Other Ambulatory Visit: Payer: Self-pay

## 2020-05-20 ENCOUNTER — Encounter: Payer: Self-pay | Admitting: Pediatrics

## 2020-05-20 ENCOUNTER — Ambulatory Visit (INDEPENDENT_AMBULATORY_CARE_PROVIDER_SITE_OTHER): Payer: No Typology Code available for payment source | Admitting: Pediatrics

## 2020-05-20 VITALS — BP 115/82 | HR 70 | Ht 62.8 in | Wt 172.4 lb

## 2020-05-20 DIAGNOSIS — G479 Sleep disorder, unspecified: Secondary | ICD-10-CM

## 2020-05-20 DIAGNOSIS — F4323 Adjustment disorder with mixed anxiety and depressed mood: Secondary | ICD-10-CM

## 2020-05-20 DIAGNOSIS — F5002 Anorexia nervosa, binge eating/purging type: Secondary | ICD-10-CM | POA: Diagnosis not present

## 2020-05-20 DIAGNOSIS — Z1389 Encounter for screening for other disorder: Secondary | ICD-10-CM | POA: Diagnosis not present

## 2020-05-20 DIAGNOSIS — F902 Attention-deficit hyperactivity disorder, combined type: Secondary | ICD-10-CM | POA: Diagnosis not present

## 2020-05-20 LAB — POCT URINALYSIS DIPSTICK
Bilirubin, UA: NEGATIVE
Blood, UA: NEGATIVE
Glucose, UA: NEGATIVE
Ketones, UA: NEGATIVE
Leukocytes, UA: NEGATIVE
Nitrite, UA: NEGATIVE
Protein, UA: POSITIVE — AB
Spec Grav, UA: 1.025 (ref 1.010–1.025)
Urobilinogen, UA: NEGATIVE E.U./dL — AB
pH, UA: 5 (ref 5.0–8.0)

## 2020-05-20 MED ORDER — LISDEXAMFETAMINE DIMESYLATE 60 MG PO CAPS: 60.0000 mg | ORAL_CAPSULE | ORAL | 0 refills | Status: DC

## 2020-05-20 MED ORDER — LEVONORGESTREL 1.5 MG PO TABS: 1.5000 mg | ORAL_TABLET | Freq: Once | ORAL | 0 refills | Status: AC

## 2020-05-20 MED ORDER — BENZONATATE 100 MG PO CAPS: 100.0000 mg | ORAL_CAPSULE | Freq: Three times a day (TID) | ORAL | 0 refills | Status: DC | PRN

## 2020-05-20 MED ORDER — ESCITALOPRAM OXALATE 20 MG PO TABS: 20.0000 mg | ORAL_TABLET | Freq: Every day | ORAL | 1 refills | Status: DC

## 2020-05-20 MED ORDER — AMPHETAMINE-DEXTROAMPHETAMINE 15 MG PO TABS: 15.0000 mg | ORAL_TABLET | Freq: Every day | ORAL | 0 refills | Status: DC
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
## 2020-05-25 ENCOUNTER — Encounter: Payer: No Typology Code available for payment source | Admitting: Registered"

## 2020-06-20 ENCOUNTER — Other Ambulatory Visit: Payer: Self-pay

## 2020-06-20 DIAGNOSIS — F902 Attention-deficit hyperactivity disorder, combined type: Secondary | ICD-10-CM

## 2020-06-21 ENCOUNTER — Other Ambulatory Visit: Payer: Self-pay | Admitting: Pediatrics

## 2020-06-21 DIAGNOSIS — F902 Attention-deficit hyperactivity disorder, combined type: Secondary | ICD-10-CM

## 2020-06-21 MED ORDER — LISDEXAMFETAMINE DIMESYLATE 60 MG PO CAPS: 60.0000 mg | ORAL_CAPSULE | ORAL | 0 refills | Status: DC

## 2020-06-21 MED ORDER — AMPHETAMINE-DEXTROAMPHETAMINE 15 MG PO TABS: 15.0000 mg | ORAL_TABLET | Freq: Every day | ORAL | 0 refills | Status: DC

## 2020-07-05 MED ORDER — LEVONORGESTREL 1.5 MG PO TABS: 1.5000 mg | ORAL_TABLET | Freq: Once | ORAL | 0 refills | Status: AC

## 2020-07-06 ENCOUNTER — Other Ambulatory Visit: Payer: Self-pay | Admitting: Family

## 2020-07-06 MED ORDER — ULIPRISTAL ACETATE 30 MG PO TABS: 1.0000 | ORAL_TABLET | Freq: Once | ORAL | 0 refills | Status: AC

## 2020-07-31 ENCOUNTER — Other Ambulatory Visit: Payer: Self-pay

## 2020-07-31 DIAGNOSIS — F902 Attention-deficit hyperactivity disorder, combined type: Secondary | ICD-10-CM

## 2020-07-31 MED ORDER — AMPHETAMINE-DEXTROAMPHETAMINE 15 MG PO TABS: 15.0000 mg | ORAL_TABLET | Freq: Every day | ORAL | 0 refills | Status: DC

## 2020-07-31 MED ORDER — LISDEXAMFETAMINE DIMESYLATE 60 MG PO CAPS: 60.0000 mg | ORAL_CAPSULE | ORAL | 0 refills | Status: DC

## 2020-09-01 ENCOUNTER — Other Ambulatory Visit: Payer: Self-pay | Admitting: Family

## 2020-09-01 ENCOUNTER — Other Ambulatory Visit: Payer: Self-pay

## 2020-09-01 DIAGNOSIS — F902 Attention-deficit hyperactivity disorder, combined type: Secondary | ICD-10-CM

## 2020-09-02 ENCOUNTER — Other Ambulatory Visit: Payer: Self-pay | Admitting: Family

## 2020-09-02 DIAGNOSIS — F902 Attention-deficit hyperactivity disorder, combined type: Secondary | ICD-10-CM

## 2020-09-03 ENCOUNTER — Other Ambulatory Visit: Payer: Self-pay | Admitting: Family

## 2020-09-03 DIAGNOSIS — F902 Attention-deficit hyperactivity disorder, combined type: Secondary | ICD-10-CM

## 2020-09-06 ENCOUNTER — Other Ambulatory Visit: Payer: Self-pay | Admitting: Pediatrics

## 2020-09-06 DIAGNOSIS — F902 Attention-deficit hyperactivity disorder, combined type: Secondary | ICD-10-CM

## 2020-09-06 MED ORDER — AMPHETAMINE-DEXTROAMPHETAMINE 15 MG PO TABS: 15.0000 mg | ORAL_TABLET | Freq: Every day | ORAL | 0 refills | Status: DC

## 2020-09-06 MED ORDER — LISDEXAMFETAMINE DIMESYLATE 60 MG PO CAPS: 60.0000 mg | ORAL_CAPSULE | ORAL | 0 refills | Status: DC

## 2020-09-14 ENCOUNTER — Ambulatory Visit: Payer: No Typology Code available for payment source | Admitting: Pediatrics

## 2020-10-03 ENCOUNTER — Other Ambulatory Visit: Payer: Self-pay

## 2020-10-03 DIAGNOSIS — F902 Attention-deficit hyperactivity disorder, combined type: Secondary | ICD-10-CM

## 2020-10-11 ENCOUNTER — Encounter: Payer: Self-pay | Admitting: Pediatrics

## 2020-10-11 ENCOUNTER — Ambulatory Visit (INDEPENDENT_AMBULATORY_CARE_PROVIDER_SITE_OTHER): Payer: No Typology Code available for payment source | Admitting: Pediatrics

## 2020-10-11 ENCOUNTER — Other Ambulatory Visit (HOSPITAL_COMMUNITY)
Admission: RE | Admit: 2020-10-11 | Discharge: 2020-10-11 | Disposition: A | Discharge status: Home / Self Care | Payer: No Typology Code available for payment source | Source: Ambulatory Visit | Attending: Pediatrics | Admitting: Pediatrics

## 2020-10-11 ENCOUNTER — Other Ambulatory Visit: Payer: Self-pay

## 2020-10-11 VITALS — BP 100/73 | HR 73 | Ht 62.3 in | Wt 171.8 lb

## 2020-10-11 DIAGNOSIS — F902 Attention-deficit hyperactivity disorder, combined type: Secondary | ICD-10-CM

## 2020-10-11 DIAGNOSIS — F4323 Adjustment disorder with mixed anxiety and depressed mood: Secondary | ICD-10-CM | POA: Diagnosis not present

## 2020-10-11 DIAGNOSIS — Z113 Encounter for screening for infections with a predominantly sexual mode of transmission: Secondary | ICD-10-CM | POA: Insufficient documentation

## 2020-10-11 DIAGNOSIS — Z1389 Encounter for screening for other disorder: Secondary | ICD-10-CM | POA: Diagnosis not present

## 2020-10-11 DIAGNOSIS — G479 Sleep disorder, unspecified: Secondary | ICD-10-CM

## 2020-10-11 DIAGNOSIS — R824 Acetonuria: Secondary | ICD-10-CM | POA: Diagnosis not present

## 2020-10-11 DIAGNOSIS — F5002 Anorexia nervosa, binge eating/purging type: Secondary | ICD-10-CM | POA: Diagnosis not present

## 2020-10-11 DIAGNOSIS — Z3202 Encounter for pregnancy test, result negative: Secondary | ICD-10-CM

## 2020-10-11 LAB — POCT URINALYSIS DIPSTICK
Bilirubin, UA: NEGATIVE
Blood, UA: NEGATIVE
Glucose, UA: NEGATIVE
Nitrite, UA: NEGATIVE
Protein, UA: NEGATIVE
Spec Grav, UA: 1.015 (ref 1.010–1.025)
Urobilinogen, UA: 1 E.U./dL
pH, UA: 6 (ref 5.0–8.0)

## 2020-10-11 LAB — POCT URINE PREGNANCY: Preg Test, Ur: NEGATIVE

## 2020-10-11 MED ORDER — LISDEXAMFETAMINE DIMESYLATE 60 MG PO CAPS: 60.0000 mg | ORAL_CAPSULE | ORAL | 0 refills | Status: DC

## 2020-10-11 MED ORDER — AMPHETAMINE-DEXTROAMPHETAMINE 15 MG PO TABS: 15.0000 mg | ORAL_TABLET | Freq: Every day | ORAL | 0 refills | Status: DC

## 2020-10-11 MED ORDER — DESVENLAFAXINE SUCCINATE ER 50 MG PO TB24
50.0000 mg | ORAL_TABLET | Freq: Every day | ORAL | 3 refills | Status: DC
Start: 2020-10-11 — End: 2021-02-16

## 2020-10-11 MED ORDER — LEVONORGESTREL-ETHINYL ESTRAD 0.1-20 MG-MCG PO TABS: 1.0000 | ORAL_TABLET | Freq: Every day | ORAL | 3 refills | Status: DC

## 2020-10-11 NOTE — Progress Notes (Signed)
History was provided by the patient.  Regina Wilkerson is a 25 y.o. female who is here for anorexia binge/purge,ADHD, anxiety, depression, sleep disturbance.  Pcp, No   HPI:  Pt reports that she has been "vibing." School has been difficult though and things are messy and stressful.   Eating has been worse the past few weeks. She felt like she was eating too much so stopped taking her lexapro. She had totally stopped purging but now has been doing more frequently r/t stress. Once a day.   24 hour recall:  B:  L:  D: veggie quesadilla with rice (purged)  S: applesauce Not drinking enough water   Dizzy sometimes. + headaches.   She didn't notice a huge benefit from lexapro. Has not had much success with multiple other medications. Willing to try another today.   Not currently taking OCP. She feels like it made sex weird for her. It was more painful. She is currently sexually active but not using condoms. Does not want to be pregnant. Would have a termination if this happened. Not interested in LARC but willing to try another OCP.   Seeing [employer] for counseling. Really likes her and has a good relationship.   PHQ-SADS Last 3 Score only 10/11/2020 03/02/2020 10/13/2019  PHQ-15 Score 13 - 16  Total GAD-7 Score 18 - 16  PHQ-9 Total Score 12 17 12      No LMP recorded.  Patient Active Problem List   Diagnosis Date Noted  . Elevated blood-pressure reading without diagnosis of hypertension 04/08/2020  . Gastroesophageal reflux disease 01/07/2020  . Anorexia nervosa, binge eating/purging type 10/15/2019  . Slow transit constipation 10/15/2019  . Adjustment disorder with mixed anxiety and depressed mood 10/15/2019  . Sleep disturbance 10/15/2019  . Chronic nonintractable headache 10/15/2019  . Attention deficit hyperactivity disorder (ADHD), combined type 10/15/2019    Current Outpatient Medications on File Prior to Visit  Medication Sig Dispense Refill  . Ferrous Fumarate  (HEMOCYTE - 106 MG FE) 324 (106 Fe) MG TABS tablet Take 1 tablet (106 mg of iron total) by mouth daily. 30 tablet 3  . pantoprazole (PROTONIX) 40 MG tablet Take 1 tablet (40 mg total) by mouth daily. 30 tablet 3  . propranolol (INDERAL) 20 MG tablet Take 20 mg by mouth as needed.     No current facility-administered medications on file prior to visit.    Allergies  Allergen Reactions  . Mango Flavor   . Red Dye   . Shellfish Allergy      Physical Exam:    Vitals:   10/11/20 1107  BP: 100/73  Pulse: 73  Weight: 171 lb 12.8 oz (77.9 kg)  Height: 5' 2.3" (1.582 m)    Growth percentile SmartLinks can only be used for patients less than 68 years old.  Physical Exam Vitals and nursing note reviewed.  Constitutional:      General: She is not in acute distress.    Appearance: She is well-developed.  Neck:     Thyroid: No thyromegaly.  Cardiovascular:     Rate and Rhythm: Normal rate and regular rhythm.     Heart sounds: No murmur heard.   Pulmonary:     Breath sounds: Normal breath sounds.  Abdominal:     Palpations: Abdomen is soft. There is no mass.     Tenderness: There is no abdominal tenderness. There is no guarding.  Musculoskeletal:     Right lower leg: No edema.     Left lower  leg: No edema.  Lymphadenopathy:     Cervical: No cervical adenopathy.  Skin:    General: Skin is warm.     Capillary Refill: Capillary refill takes less than 2 seconds.     Findings: No rash.  Neurological:     Mental Status: She is alert.     Comments: No tremor  Psychiatric:        Mood and Affect: Mood normal.     Assessment/Plan: 1. Attention deficit hyperactivity disorder (ADHD), combined type Continue ADHD medications as prior. Working well.  - lisdexamfetamine (VYVANSE) 60 MG capsule; Take 1 capsule (60 mg total) by mouth every morning.  Dispense: 30 capsule; Refill: 0 - amphetamine-dextroamphetamine (ADDERALL) 15 MG tablet; Take 1 tablet by mouth daily.  Dispense: 30  tablet; Refill: 0  2. Anorexia nervosa, binge eating/purging type Labs today given more purging recently and urine ketones. Suspect the lexapro was helping her mood more than she was able to recognize as she was eating three meals a day, though this was difficulty for body image for her.  - Comprehensive metabolic panel - Magnesium - Phosphorus  3. Adjustment disorder with mixed anxiety and depressed mood Will try pristiq. Suspect she may benefit from a mood stabilizer.  - desvenlafaxine (PRISTIQ) 50 MG 24 hr tablet; Take 1 tablet (50 mg total) by mouth daily.  Dispense: 30 tablet; Refill: 3  4. Urine ketones As above.  - Comprehensive metabolic panel - Magnesium - Phosphorus  5. Pregnancy examination or test, negative result Negative today. Restart birth control. Some hirsutism on exam- suspect she could potentially have PCOS. May be worth further eval in the future.  - POCT urine pregnancy  6. Routine screening for STI (sexually transmitted infection) Per pt request.  - Urine cytology ancillary only - HIV antibody (with reflex) - RPR  Return in 2 weeks   Jonathon Resides, FNP   Level of Service: This visit lasted in excess of 40 minutes. More than 50% of the visit was devoted to counseling for the above conditions and phlebotomy was provided by this provider.

## 2020-10-11 NOTE — Patient Instructions (Addendum)
Start new birth control pill now. Urine pregnancy was negative  Start desvenlafaxine 50 mg daily  Come back in 3 weeks  Labs today

## 2020-10-12 LAB — URINE CYTOLOGY ANCILLARY ONLY
Chlamydia: NEGATIVE
Comment: NEGATIVE
Comment: NEGATIVE
Comment: NORMAL
Neisseria Gonorrhea: NEGATIVE
Trichomonas: NEGATIVE

## 2020-10-14 ENCOUNTER — Ambulatory Visit: Payer: No Typology Code available for payment source | Admitting: Pediatrics

## 2020-10-14 ENCOUNTER — Other Ambulatory Visit: Payer: Self-pay

## 2020-10-14 DIAGNOSIS — F5002 Anorexia nervosa, binge eating/purging type: Secondary | ICD-10-CM

## 2020-10-14 LAB — HIV ANTIBODY (ROUTINE TESTING W REFLEX): HIV 1&2 Ab, 4th Generation: NONREACTIVE

## 2020-10-14 LAB — COMPREHENSIVE METABOLIC PANEL

## 2020-10-14 LAB — RPR: RPR Ser Ql: NONREACTIVE

## 2020-10-15 LAB — COMPREHENSIVE METABOLIC PANEL
AG Ratio: 1.4 (calc) (ref 1.0–2.5)
ALT: 8 U/L (ref 6–29)
AST: 14 U/L (ref 10–30)
Albumin: 4.4 g/dL (ref 3.6–5.1)
Alkaline phosphatase (APISO): 38 U/L (ref 31–125)
BUN: 14 mg/dL (ref 7–25)
CO2: 17 mmol/L — ABNORMAL LOW (ref 20–32)
Calcium: 9.6 mg/dL (ref 8.6–10.2)
Chloride: 103 mmol/L (ref 98–110)
Creat: 0.8 mg/dL (ref 0.50–1.10)
Globulin: 3.2 g/dL (calc) (ref 1.9–3.7)
Glucose, Bld: 72 mg/dL (ref 65–99)
Potassium: 4.3 mmol/L (ref 3.5–5.3)
Sodium: 141 mmol/L (ref 135–146)
Total Bilirubin: 0.5 mg/dL (ref 0.2–1.2)
Total Protein: 7.6 g/dL (ref 6.1–8.1)

## 2020-10-15 LAB — MAGNESIUM: Magnesium: 1.9 mg/dL (ref 1.5–2.5)

## 2020-10-15 LAB — PHOSPHORUS: Phosphorus: 4 mg/dL (ref 2.5–4.5)

## 2020-10-21 ENCOUNTER — Other Ambulatory Visit: Payer: Self-pay | Admitting: Pediatrics

## 2020-10-21 MED ORDER — FLUCONAZOLE 150 MG PO TABS: ORAL_TABLET | ORAL | 0 refills | Status: DC

## 2020-10-26 ENCOUNTER — Telehealth: Payer: Self-pay

## 2020-10-26 NOTE — Telephone Encounter (Signed)
Called upon request of C. Jerold Coombe, FNP-C to obtain safety check on patient. No answer, left vm to call office back. Will also send MyChart as well.

## 2020-10-29 ENCOUNTER — Ambulatory Visit (HOSPITAL_COMMUNITY)
Admission: EM | Admit: 2020-10-29 | Discharge: 2020-10-29 | Disposition: A | Discharge status: Home / Self Care | Payer: No Typology Code available for payment source | Attending: Urology | Admitting: Urology

## 2020-10-29 ENCOUNTER — Other Ambulatory Visit: Payer: Self-pay

## 2020-10-29 DIAGNOSIS — F1290 Cannabis use, unspecified, uncomplicated: Secondary | ICD-10-CM | POA: Insufficient documentation

## 2020-10-29 DIAGNOSIS — R45851 Suicidal ideations: Secondary | ICD-10-CM | POA: Diagnosis not present

## 2020-10-29 DIAGNOSIS — F4323 Adjustment disorder with mixed anxiety and depressed mood: Secondary | ICD-10-CM

## 2020-10-29 DIAGNOSIS — R451 Restlessness and agitation: Secondary | ICD-10-CM | POA: Insufficient documentation

## 2020-10-29 DIAGNOSIS — Z9152 Personal history of nonsuicidal self-harm: Secondary | ICD-10-CM | POA: Insufficient documentation

## 2020-10-29 DIAGNOSIS — Z20822 Contact with and (suspected) exposure to covid-19: Secondary | ICD-10-CM | POA: Diagnosis not present

## 2020-10-29 LAB — POCT URINE DRUG SCREEN - MANUAL ENTRY (I-SCREEN)
POC Amphetamine UR: POSITIVE — AB
POC Buprenorphine (BUP): NOT DETECTED
POC Cocaine UR: NOT DETECTED
POC Marijuana UR: POSITIVE — AB
POC Methadone UR: NOT DETECTED
POC Methamphetamine UR: NOT DETECTED
POC Morphine: NOT DETECTED
POC Oxazepam (BZO): POSITIVE — AB
POC Oxycodone UR: NOT DETECTED
POC Secobarbital (BAR): NOT DETECTED

## 2020-10-29 LAB — POC SARS CORONAVIRUS 2 AG -  ED: SARS Coronavirus 2 Ag: NEGATIVE

## 2020-10-29 LAB — POC SARS CORONAVIRUS 2 AG: SARSCOV2ONAVIRUS 2 AG: NEGATIVE

## 2020-10-29 LAB — POCT PREGNANCY, URINE: Preg Test, Ur: NEGATIVE

## 2020-10-29 MED ORDER — TRAZODONE HCL 50 MG PO TABS: 50.0000 mg | ORAL_TABLET | Freq: Every evening | ORAL | Status: DC | PRN

## 2020-10-29 MED ORDER — ACETAMINOPHEN 325 MG PO TABS: 650.0000 mg | ORAL_TABLET | Freq: Four times a day (QID) | ORAL | Status: DC | PRN

## 2020-10-29 MED ORDER — MAGNESIUM HYDROXIDE 400 MG/5ML PO SUSP: 30.0000 mL | Freq: Every day | ORAL | Status: DC | PRN

## 2020-10-29 MED ORDER — HYDROXYZINE HCL 25 MG PO TABS: 25.0000 mg | ORAL_TABLET | Freq: Three times a day (TID) | ORAL | Status: DC | PRN

## 2020-10-29 MED ORDER — MELATONIN 3 MG PO TABS: 3.0000 mg | ORAL_TABLET | Freq: Every day | ORAL | Status: DC

## 2020-10-29 MED ORDER — PANTOPRAZOLE SODIUM 40 MG PO TBEC: 40.0000 mg | DELAYED_RELEASE_TABLET | Freq: Every day | ORAL | Status: DC

## 2020-10-29 MED ORDER — ALUM & MAG HYDROXIDE-SIMETH 200-200-20 MG/5ML PO SUSP: 30.0000 mL | ORAL | Status: DC | PRN

## 2020-10-29 MED ORDER — AMPHETAMINE-DEXTROAMPHETAMINE 10 MG PO TABS: 15.0000 mg | ORAL_TABLET | Freq: Every day | ORAL | Status: DC

## 2020-10-29 MED ORDER — LISDEXAMFETAMINE DIMESYLATE 30 MG PO CAPS: 60.0000 mg | ORAL_CAPSULE | ORAL | Status: DC

## 2020-10-29 NOTE — ED Notes (Signed)
Patient advised NP, Ene that she wants to leave AMA
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
## 2020-10-29 NOTE — ED Notes (Signed)
Patient is stating that she is unable to stay here, she states that it is too quiet and that she is going to have a panic attack. Advised the patient that I will make the NP aware of her concerns.

## 2020-10-29 NOTE — BH Assessment (Signed)
Patient reports to [facility] with friend under the influence of a unknown substance . Patient reports she wants to [employer] ,but wont kill her self because she got shit to do ! Patient denies HI . AVH but states she has ingest unknown substances and taken triple the dose of her prescribed medication today . Patient is irritable and aggressive and clearly under the influence.  Patient states she  is not sure if she has smokes weed or not and reports a heart condition. Patient is emergent
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
## 2020-10-29 NOTE — ED Notes (Signed)
Patient has signed AMA forms and is being escorted to obtain her belongings and into the waiting room to wait for her ride

## 2020-10-29 NOTE — ED Notes (Signed)
Patient has been brought on the unit, she is currently sitting in bed.

## 2020-10-29 NOTE — ED Notes (Signed)
Patient denies pain and is resting comfortably.  

## 2020-10-30 ENCOUNTER — Emergency Department (HOSPITAL_COMMUNITY)
Admission: EM | Admit: 2020-10-30 | Discharge: 2020-10-31 | Disposition: A | Discharge status: Other Acute Inpatient Hospital | Payer: No Typology Code available for payment source | Attending: Emergency Medicine | Admitting: Emergency Medicine

## 2020-10-30 DIAGNOSIS — Z20822 Contact with and (suspected) exposure to covid-19: Secondary | ICD-10-CM | POA: Insufficient documentation

## 2020-10-30 DIAGNOSIS — F332 Major depressive disorder, recurrent severe without psychotic features: Secondary | ICD-10-CM | POA: Diagnosis not present

## 2020-10-30 DIAGNOSIS — T50902A Poisoning by unspecified drugs, medicaments and biological substances, intentional self-harm, initial encounter: Secondary | ICD-10-CM

## 2020-10-30 DIAGNOSIS — Z046 Encounter for general psychiatric examination, requested by authority: Secondary | ICD-10-CM | POA: Diagnosis present

## 2020-10-30 LAB — LIPID PANEL
Cholesterol: 209 mg/dL — ABNORMAL HIGH (ref 0–200)
HDL: 55 mg/dL (ref >40)
LDL Cholesterol: 134 mg/dL — ABNORMAL HIGH (ref 0–99)
Total CHOL/HDL Ratio: 3.8 RATIO
Triglycerides: 99 mg/dL (ref <150)
VLDL: 20 mg/dL (ref 0–40)

## 2020-10-30 LAB — CBC WITH DIFFERENTIAL/PLATELET
Abs Immature Granulocytes: 0.03 10*3/uL (ref 0.00–0.07)
Basophils Absolute: 0 10*3/uL (ref 0.0–0.1)
Basophils Relative: 1 %
Eosinophils Absolute: 0.2 10*3/uL (ref 0.0–0.5)
Eosinophils Relative: 2 %
HCT: 40.1 % (ref 36.0–46.0)
Hemoglobin: 12.7 g/dL (ref 12.0–15.0)
Immature Granulocytes: 0 %
Lymphocytes Relative: 22 %
Lymphs Abs: 1.8 10*3/uL (ref 0.7–4.0)
MCH: 27.9 pg (ref 26.0–34.0)
MCHC: 31.7 g/dL (ref 30.0–36.0)
MCV: 88.1 fL (ref 80.0–100.0)
Monocytes Absolute: 0.6 10*3/uL (ref 0.1–1.0)
Monocytes Relative: 7 %
Neutro Abs: 5.4 10*3/uL (ref 1.7–7.7)
Neutrophils Relative %: 68 %
Platelets: 342 10*3/uL (ref 150–400)
RBC: 4.55 MIL/uL (ref 3.87–5.11)
RDW: 12.7 % (ref 11.5–15.5)
WBC: 7.9 10*3/uL (ref 4.0–10.5)
nRBC: 0 % (ref 0.0–0.2)

## 2020-10-30 LAB — COMPREHENSIVE METABOLIC PANEL
ALT: 11 U/L (ref 0–44)
ALT: 10 U/L (ref 0–44)
AST: 13 U/L — ABNORMAL LOW (ref 15–41)
AST: 12 U/L — ABNORMAL LOW (ref 15–41)
Albumin: 4.1 g/dL (ref 3.5–5.0)
Albumin: 3.8 g/dL (ref 3.5–5.0)
Alkaline Phosphatase: 33 U/L — ABNORMAL LOW (ref 38–126)
Alkaline Phosphatase: 30 U/L — ABNORMAL LOW (ref 38–126)
Anion gap: 9 (ref 5–15)
Anion gap: 9 (ref 5–15)
BUN: 10 mg/dL (ref 6–20)
BUN: 13 mg/dL (ref 6–20)
CO2: 27 mmol/L (ref 22–32)
CO2: 23 mmol/L (ref 22–32)
Calcium: 9.3 mg/dL (ref 8.9–10.3)
Calcium: 9 mg/dL (ref 8.9–10.3)
Chloride: 102 mmol/L (ref 98–111)
Chloride: 105 mmol/L (ref 98–111)
Creatinine, Ser: 0.68 mg/dL (ref 0.44–1.00)
Creatinine, Ser: 0.68 mg/dL (ref 0.44–1.00)
GFR, Estimated: >60 mL/min (ref >60)
GFR, Estimated: >60 mL/min (ref >60)
Glucose, Bld: 83 mg/dL (ref 70–99)
Glucose, Bld: 110 mg/dL — ABNORMAL HIGH (ref 70–99)
Potassium: 4.2 mmol/L (ref 3.5–5.1)
Potassium: 4.1 mmol/L (ref 3.5–5.1)
Sodium: 138 mmol/L (ref 135–145)
Sodium: 137 mmol/L (ref 135–145)
Total Bilirubin: 0.3 mg/dL (ref 0.3–1.2)
Total Bilirubin: 0.5 mg/dL (ref 0.3–1.2)
Total Protein: 7.2 g/dL (ref 6.5–8.1)
Total Protein: 7.4 g/dL (ref 6.5–8.1)

## 2020-10-30 LAB — CBC
HCT: 38.4 % (ref 36.0–46.0)
Hemoglobin: 12.6 g/dL (ref 12.0–15.0)
MCH: 28.4 pg (ref 26.0–34.0)
MCHC: 32.8 g/dL (ref 30.0–36.0)
MCV: 86.7 fL (ref 80.0–100.0)
Platelets: 289 10*3/uL (ref 150–400)
RBC: 4.43 MIL/uL (ref 3.87–5.11)
RDW: 12.5 % (ref 11.5–15.5)
WBC: 7 10*3/uL (ref 4.0–10.5)
nRBC: 0 % (ref 0.0–0.2)

## 2020-10-30 LAB — I-STAT BETA HCG BLOOD, ED (MC, WL, AP ONLY): I-stat hCG, quantitative: <5 m[IU]/mL (ref <5)

## 2020-10-30 LAB — HEMOGLOBIN A1C
Hgb A1c MFr Bld: 5.3 % (ref 4.8–5.6)
Mean Plasma Glucose: 105.41 mg/dL

## 2020-10-30 LAB — RESP PANEL BY RT-PCR (FLU A&B, COVID) ARPGX2
Influenza A by PCR: NEGATIVE
Influenza A by PCR: NEGATIVE
Influenza B by PCR: NEGATIVE
Influenza B by PCR: NEGATIVE
SARS Coronavirus 2 by RT PCR: NEGATIVE
SARS Coronavirus 2 by RT PCR: NEGATIVE

## 2020-10-30 LAB — RAPID URINE DRUG SCREEN, HOSP PERFORMED
Amphetamines: POSITIVE — AB
Barbiturates: NOT DETECTED
Benzodiazepines: POSITIVE — AB
Cocaine: NOT DETECTED
Opiates: NOT DETECTED
Tetrahydrocannabinol: NOT DETECTED

## 2020-10-30 LAB — ETHANOL
Alcohol, Ethyl (B): <10 mg/dL (ref <10)
Alcohol, Ethyl (B): <10 mg/dL (ref <10)

## 2020-10-30 LAB — TSH: TSH: 1.473 u[IU]/mL (ref 0.350–4.500)

## 2020-10-30 LAB — MAGNESIUM: Magnesium: 2 mg/dL (ref 1.7–2.4)

## 2020-10-30 LAB — ACETAMINOPHEN LEVEL
Acetaminophen (Tylenol), Serum: <10 ug/mL — ABNORMAL LOW (ref 10–30)
Acetaminophen (Tylenol), Serum: <10 ug/mL — ABNORMAL LOW (ref 10–30)

## 2020-10-30 LAB — SALICYLATE LEVEL: Salicylate Lvl: <7 mg/dL — ABNORMAL LOW (ref 7.0–30.0)

## 2020-10-30 MED ORDER — SODIUM CHLORIDE 0.9 % IV BOLUS
1000.0000 mL | Freq: Once | INTRAVENOUS | Status: AC
  Administered 2020-10-30: 1000 mL via INTRAVENOUS

## 2020-10-30 MED ORDER — ONDANSETRON HCL 4 MG/2ML IJ SOLN
4.0000 mg | Freq: Once | INTRAMUSCULAR | Status: AC
  Administered 2020-10-30: 4 mg via INTRAVENOUS
  Filled 2020-10-30: qty 2

## 2020-10-30 NOTE — ED Notes (Signed)
Pt changed into scrubs and belongings placed in cabinet behind triage nursing station. There are 2 bags, one with pt's white tennis shoes, 2nd bag has pt's clothes as well as a cell phone, small bag of jewelry, a wallet with keys, and a fidget spinner.

## 2020-10-30 NOTE — ED Provider Notes (Signed)
[facility] DEPT Provider Note   CSN: [id number] Arrival date & time: 10/30/20  1738     History Chief Complaint  Patient presents with  . ivc    Regina Wilkerson is a 25 y.o. female with a past medical history significant for anxiety, GERD, ADHD, eating disorder, and vitamin D deficiency who presents to the ED under IVC due to intentional overdose. Per IVC paperwork, patient took triple the amount of medication in an attempt to kill herself since leaving the hospital earlier this mroning. Per IVC, patient has told her mother and friend that she wants to die and this time she is going to succeed. She has a history of self harm and intention overdose. She states she drinks alcohol every weekend and "blacks out". During my initial evaluation, patient denies SI, HI, and auditory/visual hallucinations. She admits to taking numerous propranolol prior to arrival due to anxiety. She was just discharged from the hospital early this morning for the same. She admits to marijuana use. No tobacco use. Denies any physical complaints. Denies abdominal pain, nausea, vomiting, diarrhea, chest pain, and shortness of breath.   History obtained from patient and past medical records. No interpreter used during encounter.      Past Medical History:  Diagnosis Date  . Anxiety    Phreesia 12/10/2019  . Eating disorder   . Vitamin D deficiency     Patient Active Problem List   Diagnosis Date Noted  . Elevated blood-pressure reading without diagnosis of hypertension 04/08/2020  . Gastroesophageal reflux disease 01/07/2020  . Anorexia nervosa, binge eating/purging type 10/15/2019  . Slow transit constipation 10/15/2019  . Adjustment disorder with mixed anxiety and depressed mood 10/15/2019  . Sleep disturbance 10/15/2019  . Chronic nonintractable headache 10/15/2019  . Attention deficit hyperactivity disorder (ADHD), combined type 10/15/2019    No past surgical history on  file.   OB History   No obstetric history on file.     No family history on file.  Social History   Tobacco Use  . Smoking status: Never Smoker  . Smokeless tobacco: Never Used    Home Medications Prior to Admission medications   Medication Sig Start Date End Date Taking? Authorizing Provider  fluconazole (DIFLUCAN) 150 MG tablet Take 1 tablet today and 1 tablet 3 days from now 10/21/20   Jonathon Resides T, FNP  amphetamine-dextroamphetamine (ADDERALL) 15 MG tablet Take 1 tablet by mouth daily. 10/11/20   Trude Mcburney, FNP  desvenlafaxine (PRISTIQ) 50 MG 24 hr tablet Take 1 tablet (50 mg total) by mouth daily. 10/11/20   Trude Mcburney, FNP  Ferrous Fumarate (HEMOCYTE - 106 MG FE) 324 (106 Fe) MG TABS tablet Take 1 tablet (106 mg of iron total) by mouth daily. 03/10/20   Trude Mcburney, FNP  levonorgestrel-ethinyl estradiol (AVIANE) 0.1-20 MG-MCG tablet Take 1 tablet by mouth daily. 10/11/20   Trude Mcburney, FNP  lisdexamfetamine (VYVANSE) 60 MG capsule Take 1 capsule (60 mg total) by mouth every morning. 10/11/20   Trude Mcburney, FNP  pantoprazole (PROTONIX) 40 MG tablet Take 1 tablet (40 mg total) by mouth daily. 01/07/20   Trude Mcburney, FNP  propranolol (INDERAL) 20 MG tablet Take 20 mg by mouth as needed.    [provider]    Allergies    Mango flavor, Red dye, and Shellfish allergy  Review of Systems   Review of Systems  Constitutional: Negative for chills and fever.  HENT: Negative for rhinorrhea  and sore throat.   Eyes: Negative for visual disturbance.  Respiratory: Negative for shortness of breath.   Cardiovascular: Negative for chest pain and palpitations.  Gastrointestinal: Negative for abdominal pain.  Genitourinary: Negative for dysuria.  Musculoskeletal: Negative for myalgias.  Skin: Negative for color change and rash.  Neurological: Negative for dizziness and light-headedness.    Physical Exam Updated Vital Signs BP 122/64    Pulse 69   Temp 99.1 F (37.3 C) (Oral)   Ht 5\' 2"  (1.575 m)   Wt 77.9 kg   LMP 10/30/2020   SpO2 99%   BMI 31.42 kg/m   Physical Exam Vitals and nursing note reviewed.  Constitutional:      General: She is not in acute distress.    Appearance: She is not ill-appearing.  HENT:     Head: Normocephalic.  Eyes:     Pupils: Pupils are equal, round, and reactive to light.  Cardiovascular:     Rate and Rhythm: Normal rate and regular rhythm.     Pulses: Normal pulses.     Heart sounds: Normal heart sounds. No murmur heard. No friction rub. No gallop.   Pulmonary:     Effort: Pulmonary effort is normal.     Breath sounds: Normal breath sounds.  Abdominal:     General: Abdomen is flat. There is no distension.     Palpations: Abdomen is soft.     Tenderness: There is no abdominal tenderness. There is no guarding or rebound.  Musculoskeletal:        General: Normal range of motion.     Cervical back: Neck supple.  Skin:    General: Skin is warm and dry.  Neurological:     General: No focal deficit present.     Mental Status: She is alert.  Psychiatric:        Attention and Perception: She is inattentive.        Behavior: Behavior is withdrawn.     ED Results / Procedures / Treatments   Labs (all labs ordered are listed, but only abnormal results are displayed) Labs Reviewed  RESP PANEL BY RT-PCR (FLU A&B, COVID) [id number]  COMPREHENSIVE METABOLIC PANEL  ETHANOL  SALICYLATE LEVEL  ACETAMINOPHEN LEVEL  CBC  RAPID URINE DRUG SCREEN, HOSP PERFORMED  I-STAT BETA HCG BLOOD, ED ([facility], [facility], [facility] ONLY)    EKG None  Radiology No results found.  Procedures Procedures   Medications Ordered in ED Medications - No data to display  ED Course  I have reviewed the triage vital signs and the nursing notes.  Pertinent labs & imaging results that were available during my care of the patient were reviewed by me and considered in my medical decision making (see chart for  details).    MDM Rules/Calculators/A&P                          25 year old female presents to the ED under IVC after intentional overdose of propranolol. Difficult to obtain HPI from patient. During initial evaluation, patient denies SI, HI, and auditory/visual hallucinations. Upon arrival, vitals all within normal limits. Patient in no acute distress. Discussed case with poison control who recommended 6 hour observation from arrival due to unknown time of ingestion. Poison contrl also recommends (EKG, CMP, magnesium, salicylate, ethanol, CMP, and 4 hour tylenol level). Labs ordered.   CBC unremarkable. CMP significant for hyperglycemia. Normal renal function. No major electrolyte derangements. COVID/Influenza negative. Magnesium normal. EKG  personally reviewed which demonstrates sinus bradycardia with no signs of acute ischemia.   8:37 PM patient's BP soft. Given her possible propranolol overdose, will give 1L IVFs per recommendations from poison control.   Patient handed off to Quincy Carnes, PA-C at shift change pending 6 hour observation. If patient remains stable, and repeat acetaminophen level within normal limits, patient may be medically cleared for TTS evaluation.  Final Clinical Impression(s) / ED Diagnoses Final diagnoses:  None    Rx / DC Orders ED Discharge Orders    None       Karie Kirks 10/30/20 2207    Varney Biles, MD 11/02/20 [phone]

## 2020-10-30 NOTE — ED Notes (Signed)
Patient states she woke up this morning and felt anxious so she took a bunch of pills. Says she took propanolol but doesn't know how many. When asked if patient was having any GI discomfort or issues patient responds "I don't know. I have been sleeping"

## 2020-10-30 NOTE — ED Triage Notes (Signed)
Patient bib [facility] under IVC. IVC states patient has mental illness, took triple amounts of her medication In order to kill herself this morning. Patient verbalized to friends and family that she wanted to die. Has history of cutting and overdosing, drinking until she "blacks out". Patient denies pain in triage.

## 2020-10-30 NOTE — Progress Notes (Signed)
Notified by patient's nurse Ivar Bury., RN that patient wanted to be discharge. This NP presented to the unit to talk and assess patient. 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 Patient report that she has a final project and presentation due tomorrow (Saturday at 5pm). 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 Patient is admitted voluntarily and doesn't meet IVC criteria.

## 2020-10-30 NOTE — ED Provider Notes (Signed)
Assumed care from PA Aberman at shift change.  See prior notes for full H&P.  Briefly, 25 y.o. F here from [facility] under IVC for intentional OD of propanolol.  Labs pending.  Will need to observe until approx 11:30PM per poison control.  She is receiving IVF.  Plan:  Labs pending.  Will reassess.  Results for orders placed or performed during the hospital encounter of 10/30/20  Resp Panel by RT-PCR (Flu A&B, Covid) Nasopharyngeal Swab   Specimen: Nasopharyngeal Swab; Nasopharyngeal(NP) swabs in vial transport medium  Result Value Ref Range   SARS Coronavirus 2 by RT PCR NEGATIVE NEGATIVE   Influenza A by PCR NEGATIVE NEGATIVE   Influenza B by PCR NEGATIVE NEGATIVE  Comprehensive metabolic panel  Result Value Ref Range   Sodium 137 135 - 145 mmol/L   Potassium 4.1 3.5 - 5.1 mmol/L   Chloride 105 98 - 111 mmol/L   CO2 23 22 - 32 mmol/L   Glucose, Bld 110 (H) 70 - 99 mg/dL   BUN 13 6 - 20 mg/dL   Creatinine, Ser 0.68 0.44 - 1.00 mg/dL   Calcium 9.0 8.9 - 10.3 mg/dL   Total Protein 7.4 6.5 - 8.1 g/dL   Albumin 3.8 3.5 - 5.0 g/dL   AST 12 (L) 15 - 41 U/L   ALT 10 0 - 44 U/L   Alkaline Phosphatase 30 (L) 38 - 126 U/L   Total Bilirubin 0.5 0.3 - 1.2 mg/dL   GFR, Estimated >60 >60 mL/min   Anion gap 9 5 - 15  cbc  Result Value Ref Range   WBC 7.0 4.0 - 10.5 K/uL   RBC 4.43 3.87 - 5.11 MIL/uL   Hemoglobin 12.6 12.0 - 15.0 g/dL   HCT 38.4 36.0 - 46.0 %   MCV 86.7 80.0 - 100.0 fL   MCH 28.4 26.0 - 34.0 pg   MCHC 32.8 30.0 - 36.0 g/dL   RDW 12.5 11.5 - 15.5 %   Platelets 289 150 - 400 K/uL   nRBC 0.0 0.0 - 0.2 %  Rapid urine drug screen (hospital performed)  Result Value Ref Range   Opiates NONE DETECTED NONE DETECTED   Cocaine NONE DETECTED NONE DETECTED   Benzodiazepines POSITIVE (A) NONE DETECTED   Amphetamines POSITIVE (A) NONE DETECTED   Tetrahydrocannabinol NONE DETECTED NONE DETECTED   Barbiturates NONE DETECTED NONE DETECTED  Acetaminophen level  Result Value Ref Range    Acetaminophen (Tylenol), Serum <10 (L) 10 - 30 ug/mL  Ethanol  Result Value Ref Range   Alcohol, Ethyl (B) <35 <45 mg/dL  Salicylate level  Result Value Ref Range   Salicylate Lvl <6.2 (L) 7.0 - 30.0 mg/dL  Magnesium  Result Value Ref Range   Magnesium 2.0 1.7 - 2.4 mg/dL  Acetaminophen level  Result Value Ref Range   Acetaminophen (Tylenol), Serum <10 (L) 10 - 30 ug/mL  I-Stat beta hCG blood, ED  Result Value Ref Range   I-stat hCG, quantitative <5.0 <5 mIU/mL   Comment 3           No results found.   11:19 PM Labs all reassuring.  BP is stable.  Medically cleared.  Patient now states she feels anxious and wants to bash her head into a wall to "make her stay here longer".  Reportedly overdose on klonopin and propanolol.  Will try to avoid giving additional medications.  Will get TTS consult.  TTS has evaluated, recommends IP treatment.  [facility] is at capacity.  Recommends  transfer to [facility] for ongoing management and observation.   Larene Pickett, PA-C 10/31/20 0057    Fatima Blank, MD 10/31/20 [phone]

## 2020-10-30 NOTE — ED Notes (Signed)
Pt belongings were brought back from triage.  Two bags were consolidated into one bag and placed in the cabinet at nurses station labeled "Patient Belongings 16-18 Resus A".

## 2020-10-30 NOTE — ED Provider Notes (Addendum)
Behavioral Health Admission H&P [facility] & OBS)  Date: 10/30/20 Patient Name: Regina Wilkerson MRN: [id number] Chief Complaint:  Chief Complaint  Patient presents with  . Urgent Emergent Eval   Chief Complaint/Presenting Problem: Depression  Diagnoses:  Final diagnoses:  Adjustment disorder with mixed anxiety and depressed mood    HPI: Regina Wilkerson is a 25 y/o female. Patient presented to [facility] voluntarily with complaint of "increased agitation, withdraw from [place], and stress." Patient report that she recently stopped taking her antidepressant Pristiq due to suicidal ideation.  She report that antidepressants make her suicidal. She report that her last dose was 10/23/2020. She is endorsing passive suicidal ideation and states "I would love for something bad to happen but that would be inconvenient because I wouldn't be able to finish my school project and classes and its the end of semester. 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 She admits to drinking alcohol socially, last drink was about 3 weeks ago and using marijuana about 1 week ago. She denies other illicit drug use. 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 She admits to past hx of self-harming by cutting; she report engaging in "rough sex with partner to relieve stress."  Collateral information obtained by TTS Counselor Gerre Scull from patient's  partner [place]. Per TTS Counselor, patient's partner denied any safety concerns.   PHQ 2-9:  [place] Visit from 10/11/2020 in [facility] [place] from 03/02/2020 in [facility] Office Visit from 10/13/2019 in [facility] [place]  Thoughts that you would be better off dead, or of hurting yourself in some way -- Not at all --  PHQ-9 Total Score 12 17 12       Flowsheet Row ED from 10/29/2020 in [facility]       Total Time spent with patient: 30 minutes  Musculoskeletal  Strength & Muscle Tone: within normal limits Gait & Station: normal Patient leans: Right  Psychiatric Specialty Exam  Presentation General Appearance: No data recorded Eye Contact:No data recorded Speech:No data recorded Speech Volume:No data recorded Handedness:No data recorded  Mood and Affect  Mood:No data recorded Affect:No data recorded  Thought Process  Thought Processes:No data recorded Descriptions of Associations:No data recorded Orientation:No data recorded Thought Content:No data recorded Diagnosis of Schizophrenia or Schizoaffective disorder in past: No   Hallucinations:No data recorded Ideas of Reference:No data recorded Suicidal Thoughts:No data recorded Homicidal Thoughts:No data recorded  Sensorium  Memory:No data recorded Judgment:No data recorded Insight:No data recorded  Executive Functions  Concentration:No data recorded Attention Span:No data recorded Recall:No data recorded Fund of Knowledge:No data recorded Language:No data recorded  Psychomotor Activity  Psychomotor Activity:No data recorded  Assets  Assets:No data recorded  Sleep  Sleep:No data recorded  No data recorded  Physical Exam Nursing note reviewed.  Constitutional:      General: She is not in acute distress.     Appearance: She is well-developed.  HENT:     Head: Normocephalic and atraumatic.  Eyes:     Conjunctiva/sclera: Conjunctivae normal.  Cardiovascular:     Rate and Rhythm: Normal rate and regular rhythm.     Heart sounds: No murmur heard.   Pulmonary:     Effort: Pulmonary effort is normal. No respiratory distress.     Breath sounds: Normal breath sounds.  Abdominal:     Palpations: Abdomen is soft.     Tenderness: There is no abdominal tenderness.  Musculoskeletal:        General: Normal range of motion.     Cervical back: Neck supple.  Skin:    General: Skin is warm and dry.     Coloration: Skin is not jaundiced or pale.  Neurological:     Mental Status: She is alert and oriented to person, place, and time.  Psychiatric:        Attention and Perception: She is attentive. She does not perceive auditory or visual hallucinations.        Mood and Affect: Mood is anxious. Mood is not depressed.        Speech: Speech normal.        Behavior: Behavior normal. Behavior is cooperative.        Thought Content: Thought content normal. Thought content is not paranoid or delusional. Thought content does not include homicidal ideation. Suicidal: passive suicidal thought. Thought content does not include homicidal or suicidal plan.        Cognition and Memory: Cognition normal.        Judgment: Judgment is not impulsive.    Review of Systems  Constitutional: Negative.  Negative for chills and fever.  HENT: Negative.  Negative for ear discharge and ear pain.   Eyes: Negative.  Negative for pain, discharge and redness.  Respiratory: Negative.  Negative for cough and hemoptysis.   Cardiovascular: Negative.  Negative for chest pain and palpitations.  Gastrointestinal: Negative.  Negative for abdominal pain, nausea and vomiting.  Genitourinary: Negative.   Musculoskeletal: Negative.   Skin: Negative.   Neurological: Negative.  Negative for sensory change and speech change.   Endo/Heme/Allergies: Negative.   Psychiatric/Behavioral: Positive for substance abuse and suicidal ideas (passive SI). The patient is nervous/anxious.     Blood pressure 121/88, pulse 73, temperature 97.8 F (36.6 C), temperature source Tympanic, resp. rate 18, SpO2 99 %. There is no height or weight on file to calculate BMI.  Past Psychiatric History:   Is the patient at risk to self? No  Has the patient been a risk to self in the past 6 months? No .    Has the patient been a risk to self within the distant past? No   Is the patient a risk to others? No   Has the patient been a risk to others in the past 6 months? No   Has the patient been a risk to others within the distant past? No   Past Medical History:  Past Medical History:  Diagnosis Date  . Anxiety    Phreesia 12/10/2019  . Eating disorder   . Vitamin D deficiency    No past surgical history on file.  Family History: No family history on file.  Social History:  Social History  Socioeconomic History  . Marital status: Single    Spouse name: Not on file  . Number of children: Not on file  . Years of education: Not on file  . Highest education level: Not on file  Occupational History  . Not on file  Tobacco Use  . Smoking status: Never Smoker  . Smokeless tobacco: Never Used  Substance and Sexual Activity  . Alcohol use: Not on file  . Drug use: Not on file  . Sexual activity: Not on file  Other Topics Concern  . Not on file  Social History Narrative  . Not on file   Social Determinants of Health   Financial Resource Strain: Not on file  Food Insecurity: Not on file  Transportation Needs: Not on file  Physical Activity: Not on file  Stress: Not on file  Social Connections: Not on file  Intimate Partner Violence: Not on file    SDOH:  SDOH Screenings   Alcohol Screen: Not on file  Depression (PHQ2-9): Medium Risk  . PHQ-2 Score: 12  Financial Resource Strain: Not on file  Food Insecurity:  Not on file  Housing: Not on file  Physical Activity: Not on file  Social Connections: Not on file  Stress: Not on file  Tobacco Use: Low Risk   . Smoking Tobacco Use: Never Smoker  . Smokeless Tobacco Use: Never Used  Transportation Needs: Not on file    Last Labs:  Admission on 10/29/2020, Discharged on 10/29/2020  Component Date Value Ref Range Status  . SARS Coronavirus 2 by RT PCR 10/29/2020 NEGATIVE  NEGATIVE Final   Comment: (NOTE) SARS-CoV-2 target nucleic acids are NOT DETECTED.  The SARS-CoV-2 RNA is generally detectable in upper respiratory specimens during the acute phase of infection. The lowest concentration of SARS-CoV-2 viral copies this assay can detect is 138 copies/mL. A negative result does not preclude SARS-Cov-2 infection and should not be used as the sole basis for treatment or other patient management decisions. A negative result may occur with  improper specimen collection/handling, submission of specimen other than nasopharyngeal swab, presence of viral mutation(s) within the areas targeted by this assay, and inadequate number of viral copies(<138 copies/mL). A negative result must be combined with clinical observations, patient history, and epidemiological information. The expected result is Negative.  Fact Sheet for Patients:  EntrepreneurPulse.com.au  Fact Sheet for Healthcare Providers:  IncredibleEmployment.be  This test is no                          t yet approved or cleared by the [place] FDA and  has been authorized for detection and/or diagnosis of SARS-CoV-2 by FDA under an Emergency Use Authorization (EUA). This EUA will remain  in effect (meaning this test can be used) for the duration of the COVID-19 declaration under Section 564(b)(1) of the Act, 21 U.S.C.section 360bbb-3(b)(1), unless the authorization is terminated  or revoked sooner.      . Influenza A by PCR 10/29/2020 NEGATIVE   NEGATIVE Final  . Influenza B by PCR 10/29/2020 NEGATIVE  NEGATIVE Final   Comment: (NOTE) The Xpert Xpress SARS-CoV-2/FLU/RSV plus assay is intended as an aid in the diagnosis of influenza from Nasopharyngeal swab specimens and should not be used as a sole basis for treatment. Nasal washings and aspirates are unacceptable for Xpert Xpress SARS-CoV-2/FLU/RSV testing.  Fact Sheet for Patients: EntrepreneurPulse.com.au  Fact Sheet for Healthcare Providers: IncredibleEmployment.be  This test is not yet approved or  cleared by the [place] and has been authorized for detection and/or diagnosis of SARS-CoV-2 by FDA under an Emergency Use Authorization (EUA). This EUA will remain in effect (meaning this test can be used) for the duration of the COVID-19 declaration under Section 564(b)(1) of the Act, 21 U.S.C. section 360bbb-3(b)(1), unless the authorization is terminated or revoked.  Performed at [facility], [place] [address] [place], [place] [address]   . SARS Coronavirus 2 Ag 10/29/2020 Negative  Negative Preliminary  . WBC 10/30/2020 7.9  4.0 - 10.5 K/uL Final  . RBC 10/30/2020 4.55  3.87 - 5.11 MIL/uL Final  . Hemoglobin 10/30/2020 12.7  12.0 - 15.0 g/dL Final  . HCT 10/30/2020 40.1  36.0 - 46.0 % Final  . MCV 10/30/2020 88.1  80.0 - 100.0 fL Final  . MCH 10/30/2020 27.9  26.0 - 34.0 pg Final  . MCHC 10/30/2020 31.7  30.0 - 36.0 g/dL Final  . RDW 10/30/2020 12.7  11.5 - 15.5 % Final  . Platelets 10/30/2020 342  150 - 400 K/uL Final  . nRBC 10/30/2020 0.0  0.0 - 0.2 % Final  . Neutrophils Relative % 10/30/2020 68  % Final  . Neutro Abs 10/30/2020 5.4  1.7 - 7.7 K/uL Final  . Lymphocytes Relative 10/30/2020 22  % Final  . Lymphs Abs 10/30/2020 1.8  0.7 - 4.0 K/uL Final  . Monocytes Relative 10/30/2020 7  % Final  . Monocytes Absolute 10/30/2020 0.6  0.1 - 1.0 K/uL Final  . Eosinophils Relative 10/30/2020 2  % Final  .  Eosinophils Absolute 10/30/2020 0.2  0.0 - 0.5 K/uL Final  . Basophils Relative 10/30/2020 1  % Final  . Basophils Absolute 10/30/2020 0.0  0.0 - 0.1 K/uL Final  . Immature Granulocytes 10/30/2020 0  % Final  . Abs Immature Granulocytes 10/30/2020 0.03  0.00 - 0.07 K/uL Final   Performed at [facility], [place] [address] [place], [place] [address]  . POC Amphetamine UR 10/29/2020 Positive* NONE DETECTED (Cut Off Level 1000 ng/mL) Final  . POC Secobarbital (BAR) 10/29/2020 None Detected  NONE DETECTED (Cut Off Level 300 ng/mL) Final  . POC Buprenorphine (BUP) 10/29/2020 None Detected  NONE DETECTED (Cut Off Level 10 ng/mL) Final  . POC Oxazepam (BZO) 10/29/2020 Positive* NONE DETECTED (Cut Off Level 300 ng/mL) Final  . POC Cocaine UR 10/29/2020 None Detected  NONE DETECTED (Cut Off Level 300 ng/mL) Final  . POC Methamphetamine UR 10/29/2020 None Detected  NONE DETECTED (Cut Off Level 1000 ng/mL) Final  . POC Morphine 10/29/2020 None Detected  NONE DETECTED (Cut Off Level 300 ng/mL) Final  . POC Oxycodone UR 10/29/2020 None Detected  NONE DETECTED (Cut Off Level 100 ng/mL) Final  . POC Methadone UR 10/29/2020 None Detected  NONE DETECTED (Cut Off Level 300 ng/mL) Final  . POC Marijuana UR 10/29/2020 Positive* NONE DETECTED (Cut Off Level 50 ng/mL) Final  . SARSCOV2ONAVIRUS 2 AG 10/29/2020 NEGATIVE  NEGATIVE Final   Comment: (NOTE) SARS-CoV-2 antigen NOT DETECTED.   Negative results are presumptive.  Negative results do not preclude SARS-CoV-2 infection and should not be used as the sole basis for treatment or other patient management decisions, including infection  control decisions, particularly in the presence of clinical signs and  symptoms consistent with COVID-19, or in those who have been in contact with the virus.  Negative results must be combined with clinical observations, patient history, and epidemiological information. The expected result is Negative.  Fact Sheet for  Patients: HandmadeRecipes.com.cy  Fact Sheet for Healthcare Providers: FuneralLife.at  This test is not yet approved or cleared by the [place] FDA and  has been authorized for detection and/or diagnosis of SARS-CoV-2 by FDA under an Emergency Use Authorization (EUA).  This EUA will remain in effect (meaning this test can be used) for the duration of  the COV                          ID-19 declaration under Section 564(b)(1) of the Act, 21 U.S.C. section 360bbb-3(b)(1), unless the authorization is terminated or revoked sooner.    . Preg Test, Ur 10/29/2020 NEGATIVE  NEGATIVE Final   Comment:        THE SENSITIVITY OF THIS METHODOLOGY IS >24 mIU/mL   Clinical Support on 10/14/2020  Component Date Value Ref Range Status  . Glucose, Bld 10/14/2020 72  65 - 99 mg/dL Final   Comment: .            Fasting reference interval .   . BUN 10/14/2020 14  7 - 25 mg/dL Final  . Creat 10/14/2020 0.80  0.50 - 1.10 mg/dL Final  . BUN/Creatinine Ratio 65/78/4696 NOT APPLICABLE  6 - 22 (calc) Final  . Sodium 10/14/2020 141  135 - 146 mmol/L Final  . Potassium 10/14/2020 4.3  3.5 - 5.3 mmol/L Final  . Chloride 10/14/2020 103  98 - 110 mmol/L Final  . CO2 10/14/2020 17* 20 - 32 mmol/L Final  . Calcium 10/14/2020 9.6  8.6 - 10.2 mg/dL Final  . Total Protein 10/14/2020 7.6  6.1 - 8.1 g/dL Final  . Albumin 10/14/2020 4.4  3.6 - 5.1 g/dL Final  . Globulin 10/14/2020 3.2  1.9 - 3.7 g/dL (calc) Final  . AG Ratio 10/14/2020 1.4  1.0 - 2.5 (calc) Final  . Total Bilirubin 10/14/2020 0.5  0.2 - 1.2 mg/dL Final  . Alkaline phosphatase (APISO) 10/14/2020 38  31 - 125 U/L Final  . AST 10/14/2020 14  10 - 30 U/L Final  . ALT 10/14/2020 8  6 - 29 U/L Final  . Phosphorus 10/14/2020 4.0  2.5 - 4.5 mg/dL Final  . Magnesium 10/14/2020 1.9  1.5 - 2.5 mg/dL Final  Office Visit on 10/11/2020  Component Date Value Ref Range Status  . Preg Test, Ur 10/11/2020  Negative  Negative Final  . Color, UA 10/11/2020 yellow   Final  . Clarity, UA 10/11/2020 clear   Final  . Glucose, UA 10/11/2020 Negative  Negative Final  . Bilirubin, UA 10/11/2020 neg   Final  . Ketones, UA 10/11/2020 trace   Final  . Spec Grav, UA 10/11/2020 1.015  1.010 - 1.025 Final  . Blood, UA 10/11/2020 neg   Final  . pH, UA 10/11/2020 6.0  5.0 - 8.0 Final  . Protein, UA 10/11/2020 Negative  Negative Final  . Urobilinogen, UA 10/11/2020 1.0  0.2 or 1.0 E.U./dL Final  . Nitrite, UA 10/11/2020 neg   Final  . Leukocytes, UA 10/11/2020 Small (1+)* Negative Final  . Glucose, Bld 10/11/2020 CANCELED   Final   Comment: TEST NOT PERFORMED . Quantity not sufficient.  Result canceled by the ancillary.   . Neisseria Gonorrhea 10/11/2020 Negative   Final  . Chlamydia 10/11/2020 Negative   Final  . Trichomonas 10/11/2020 Negative   Final  . Comment 10/11/2020 Normal Reference Range Trichomonas - Negative   Final  . Comment 10/11/2020 Normal Reference  [place] Chlamydia - Negative   Final  . Comment 10/11/2020 Normal Reference Range Neisseria Gonorrhea - Negative   Final  . HIV 1&2 Ab, 4th Generation 10/11/2020 NON-REACTIVE  NON-REACTI Final   Comment: HIV-1 antigen and HIV-1/HIV-2 antibodies were not detected. There is no laboratory evidence of HIV infection. Marland Kitchen PLEASE NOTE: This information has been disclosed to you from records whose confidentiality may be protected by state law.  If your state requires such protection, then the state law prohibits you from making any further disclosure of the information without the specific written consent of the person to whom it pertains, or as otherwise permitted by law. A general authorization for the release of medical or other information is NOT sufficient for this purpose. . For additional information please refer to http://education.questdiagnostics.com/faq/FAQ106 (This link is being provided for informational/ educational purposes  only.) . Marland Kitchen The performance of this assay has not been clinically validated in patients less than 14 years old. .   . RPR Ser Ql 10/11/2020 NON-REACTIVE  NON-REACTI Final  Office Visit on 05/20/2020  Component Date Value Ref Range Status  . Color, UA 05/20/2020 yellow   Final  . Clarity, UA 05/20/2020 clear   Final  . Glucose, UA 05/20/2020 Negative  Negative Final  . Bilirubin, UA 05/20/2020 neg   Final  . Ketones, UA 05/20/2020 neg   Final  . Spec Grav, UA 05/20/2020 1.025  1.010 - 1.025 Final  . Blood, UA 05/20/2020 neg   Final  . pH, UA 05/20/2020 5.0  5.0 - 8.0 Final  . Protein, UA 05/20/2020 Positive* Negative Final  . Urobilinogen, UA 05/20/2020 negative* 0.2 or 1.0 E.U./dL Final  . Nitrite, UA 05/20/2020 neg   Final  . Leukocytes, UA 05/20/2020 Negative  Negative Final  . Appearance 05/20/2020 norm   Final  . Odor 05/20/2020 norm   Final    Allergies: Mango flavor, Red dye, and Shellfish allergy  PTA Medications: (Not in a hospital admission)   Medical Decision Making  Admit patient of overnight observation and continuous assessment with reassessment by psychiatric on 10/30/20  -continue home medication -hydroxyzine 25mg  TID prn for anxiety  -Trazodone 50mg  PRN for sleep  -obtain/review labs  Discussed plan with patient. Patient in agreement with plan.  Recommendations  Based on my evaluation the patient does not appear to have an emergency medical condition.  Ophelia Shoulder, NP 10/30/20  2:08 AM

## 2020-10-31 ENCOUNTER — Ambulatory Visit (HOSPITAL_COMMUNITY)
Admission: EM | Admit: 2020-10-31 | Discharge: 2020-10-31 | Disposition: A | Discharge status: Home / Self Care | Payer: No Typology Code available for payment source | Attending: Urology | Admitting: Urology

## 2020-10-31 ENCOUNTER — Other Ambulatory Visit: Payer: Self-pay

## 2020-10-31 DIAGNOSIS — R45851 Suicidal ideations: Secondary | ICD-10-CM | POA: Diagnosis not present

## 2020-10-31 DIAGNOSIS — F4323 Adjustment disorder with mixed anxiety and depressed mood: Secondary | ICD-10-CM

## 2020-10-31 DIAGNOSIS — F411 Generalized anxiety disorder: Secondary | ICD-10-CM

## 2020-10-31 DIAGNOSIS — F902 Attention-deficit hyperactivity disorder, combined type: Secondary | ICD-10-CM | POA: Diagnosis not present

## 2020-10-31 MED ORDER — HYDROXYZINE HCL 25 MG PO TABS
25.0000 mg | ORAL_TABLET | Freq: Three times a day (TID) | ORAL | Status: DC | PRN
  Administered 2020-10-31: 25 mg via ORAL
  Filled 2020-10-31: qty 1

## 2020-10-31 MED ORDER — ALUM & MAG HYDROXIDE-SIMETH 200-200-20 MG/5ML PO SUSP: 30.0000 mL | ORAL | Status: DC | PRN

## 2020-10-31 MED ORDER — MAGNESIUM HYDROXIDE 400 MG/5ML PO SUSP: 30.0000 mL | Freq: Every day | ORAL | Status: DC | PRN

## 2020-10-31 MED ORDER — LISDEXAMFETAMINE DIMESYLATE 30 MG PO CAPS
60.0000 mg | ORAL_CAPSULE | ORAL | Status: DC
  Administered 2020-10-31: 60 mg via ORAL
  Filled 2020-10-31: qty 2

## 2020-10-31 MED ORDER — AMPHETAMINE-DEXTROAMPHETAMINE 10 MG PO TABS
15.0000 mg | ORAL_TABLET | Freq: Every day | ORAL | Status: DC
  Administered 2020-10-31: 15 mg via ORAL
  Filled 2020-10-31: qty 2
  Filled 2020-10-31: qty 1

## 2020-10-31 MED ORDER — TRAZODONE HCL 50 MG PO TABS
50.0000 mg | ORAL_TABLET | Freq: Every evening | ORAL | Status: DC | PRN
  Administered 2020-10-31: 50 mg via ORAL
  Filled 2020-10-31: qty 1

## 2020-10-31 MED ORDER — PANTOPRAZOLE SODIUM 40 MG PO TBEC
40.0000 mg | DELAYED_RELEASE_TABLET | Freq: Every day | ORAL | Status: DC
  Administered 2020-10-31: 40 mg via ORAL
  Filled 2020-10-31: qty 1

## 2020-10-31 MED ORDER — ACETAMINOPHEN 325 MG PO TABS: 650.0000 mg | ORAL_TABLET | Freq: Four times a day (QID) | ORAL | Status: DC | PRN

## 2020-10-31 NOTE — ED Notes (Signed)
[employer] called for patient transport

## 2020-10-31 NOTE — ED Notes (Signed)
Pt offered breakfast and declined  

## 2020-10-31 NOTE — ED Notes (Signed)
Poison control called for update on patient. Information provided, per PC, case is closed.

## 2020-10-31 NOTE — ED Provider Notes (Addendum)
Behavioral Health Admission [facility]&P [facility] & OBS)  Date: 10/31/20 Patient Name: Regina Wilkerson MRN: [id number] Chief Complaint:  Chief Complaint  Patient presents with  . Urgent Emergent Eval IVC      Diagnoses:  Final diagnoses:  GAD (generalized anxiety disorder)  Suicidal ideation  Attention deficit hyperactivity disorder (ADHD), combined type  Adjustment disorder with mixed anxiety and depressed mood    HPI: Regina Wilkerson is a 25 y/o female. Patient was transferred from [facility] to [facility] for continuous assessment for safety. Patient is under IVC.   Patient assessed face to face upon arrival to [facility] patient is alert and oriented X4, calm and cooperative, speech is clear and coherent. patient's mood is depressed, affect is flat. Thought process is coherent. Patient denies SOB, chest pain, dizziness, headache, Gi/GU symptoms.  Patient reported she took ~6 tabs of propranolol 20mg  in an effort to reduce anxiety after leaving [facility]. She report that she fell asleep at her boyfriend's apartment and work up to Event organiser knocking at the door to serve her IVC paper. She report "I was talking to [place] about this place (referring to [facility]); I sent her a snap that I would rather die than to be admitted there. I guess she took it serious." Patient then states "if I wanted to kill myself I would eat a shrimp; I'm allergic."  Patient was seen here at [facility] on 10/29/2020 for the same and recommended for overnight observation but signed out [place].     PHQ 2-9:  [place] Visit from 10/11/2020 in [place] and [place] for Child and [place] from 03/02/2020 in [facility] Office Visit from 10/13/2019 in [place] and [place] for Child and [place]  Thoughts that you would be better off dead, or of hurting yourself in some way -- Not at all --  PHQ-9 Total Score 12 17 12       Flowsheet Row ED from 10/30/2020 in [place] DEPT ED from 10/29/2020 in [place] CATEGORY High Risk High Risk       Total Time spent with patient: 30 minutes  Musculoskeletal  Strength & Muscle Tone: within normal limits Gait & Station: normal Patient leans: Right  Psychiatric Specialty Exam  Presentation General Appearance: Appropriate for Environment  Eye Contact:Good  Speech:Clear and Coherent  Speech Volume:Normal  Handedness:Right   Mood and Affect  Mood:Depressed  Affect:Blunt   Thought Process  Thought Processes:No data recorded Descriptions of Associations:Intact  Orientation:Full (Time, Place and Person)  Thought Content:WDL  Diagnosis of Schizophrenia or Schizoaffective disorder in past: No   Hallucinations:Hallucinations: None  Ideas of Reference:None  Suicidal Thoughts:Suicidal Thoughts: No  Homicidal Thoughts:Homicidal Thoughts: No   Sensorium  Memory:Immediate Good; Remote Good; Recent Good  Judgment:Good  Insight:Poor   Executive Functions  Concentration:Good  Attention Span:Good  [place] of Knowledge:Good  Language:Good   Psychomotor Activity  Psychomotor Activity:Psychomotor Activity: Normal   Assets  Assets:Communication Skills; Housing; Data processing manager; Vocational/Educational   Sleep  Sleep:Sleep: Good   Nutritional Assessment (For OBS and FBC admissions only) Has the patient had a weight loss or gain of 10 pounds or more in the last 3 months?: No Has the patient had a decrease in food intake/or appetite?: No Does the patient have dental problems?: No Does the patient have eating habits or behaviors that may be indicators of an eating disorder including binging or inducing vomiting?: No Has  the patient recently lost weight without trying?: No Has the patient been eating poorly because of a decreased appetite?: No Malnutrition Screening Tool Score: 0    Physical  Exam Vitals and nursing note reviewed.  Constitutional:      General: She is not in acute distress.    Appearance: She is well-developed.  HENT:     Head: Normocephalic and atraumatic.  Eyes:     Conjunctiva/sclera: Conjunctivae normal.  Cardiovascular:     Rate and Rhythm: Normal rate and regular rhythm.     Heart sounds: No murmur heard.   Pulmonary:     Effort: Pulmonary effort is normal. No respiratory distress.     Breath sounds: Normal breath sounds.  Abdominal:     Palpations: Abdomen is soft.     Tenderness: There is no abdominal tenderness.  Musculoskeletal:     Cervical back: Neck supple.  Skin:    General: Skin is warm and dry.  Neurological:     Mental Status: She is alert.  Psychiatric:        Attention and Perception: She is attentive. She does not perceive auditory or visual hallucinations.        Mood and Affect: Mood is depressed. Affect is blunt.        Speech: Speech normal.        Behavior: Behavior normal. Behavior is not agitated or aggressive.        Thought Content: Thought content is not paranoid or delusional. Thought content does not include homicidal or suicidal ideation. Thought content does not include homicidal or suicidal plan.        Cognition and Memory: Cognition normal.    Review of Systems  Constitutional: Negative.  Negative for chills and fever.  HENT: Negative.  Negative for ear discharge and ear pain.   Eyes: Negative.   Respiratory: Negative.  Negative for cough and hemoptysis.   Cardiovascular: Negative.  Negative for chest pain and palpitations.  Gastrointestinal: Negative.  Negative for abdominal pain, nausea and vomiting.  Genitourinary: Negative.   Musculoskeletal: Negative.   Skin: Negative.   Neurological: Negative.   Endo/Heme/Allergies: Negative.   Psychiatric/Behavioral: Positive for substance abuse. Negative for depression, hallucinations and suicidal ideas. The patient is nervous/anxious.     Blood pressure  113/90, pulse 65, temperature 97.7 F (36.5 C), resp. rate 18, last menstrual period 10/30/2020, SpO2 100 %. There is no height or weight on file to calculate BMI.  Past Psychiatric History: ADHD,  Adjustment disorder, eating disorder  Is the patient at risk to self? Yes  Has the patient been a risk to self in the past 6 months? Yes .    Has the patient been a risk to self within the distant past? No   Is the patient a risk to others? No   Has the patient been a risk to others in the past 6 months? No   Has the patient been a risk to others within the distant past? No   Past Medical History:  Past Medical History:  Diagnosis Date  . Anxiety    Phreesia 12/10/2019  . Eating disorder   . Vitamin D deficiency    No past surgical history on file.  Family History: No family history on file.  Social History:  Social History   Socioeconomic History  . Marital status: Single    Spouse name: Not on file  . Number of children: Not on file  . Years of education: Not on file  .  Highest education level: Not on file  Occupational History  . Not on file  Tobacco Use  . Smoking status: Never Smoker  . Smokeless tobacco: Never Used  Substance and Sexual Activity  . Alcohol use: Not on file  . Drug use: Not on file  . Sexual activity: Not on file  Other Topics Concern  . Not on file  Social History Narrative  . Not on file   Social Determinants of Health   Financial Resource Strain: Not on file  Food Insecurity: Not on file  Transportation Needs: Not on file  Physical Activity: Not on file  Stress: Not on file  Social Connections: Not on file  Intimate Partner Violence: Not on file    SDOH:  SDOH Screenings   Alcohol Screen: Not on file  Depression (PHQ2-9): Medium Risk  . PHQ-2 Score: 12  Financial Resource Strain: Not on file  Food Insecurity: Not on file  Housing: Not on file  Physical Activity: Not on file  Social Connections: Not on file  Stress: Not on file   Tobacco Use: Low Risk   . Smoking Tobacco Use: Never Smoker  . Smokeless Tobacco Use: Never Used  Transportation Needs: Not on file    Last Labs:  Admission on 10/30/2020, Discharged on 10/31/2020  Component Date Value Ref Range Status  . Sodium 10/30/2020 137  135 - 145 mmol/L Final  . Potassium 10/30/2020 4.1  3.5 - 5.1 mmol/L Final  . Chloride 10/30/2020 105  98 - 111 mmol/L Final  . CO2 10/30/2020 23  22 - 32 mmol/L Final  . Glucose, Bld 10/30/2020 110* 70 - 99 mg/dL Final   Glucose reference range applies only to samples taken after fasting for at least 8 hours.  . BUN 10/30/2020 13  6 - 20 mg/dL Final  . Creatinine, Ser 10/30/2020 0.68  0.44 - 1.00 mg/dL Final  . Calcium 10/30/2020 9.0  8.9 - 10.3 mg/dL Final  . Total Protein 10/30/2020 7.4  6.5 - 8.1 g/dL Final  . Albumin 10/30/2020 3.8  3.5 - 5.0 g/dL Final  . AST 10/30/2020 12* 15 - 41 U/L Final  . ALT 10/30/2020 10  0 - 44 U/L Final  . Alkaline Phosphatase 10/30/2020 30* 38 - 126 U/L Final  . Total Bilirubin 10/30/2020 0.5  0.3 - 1.2 mg/dL Final  . GFR, Estimated 10/30/2020 >60  >60 mL/min Final   Comment: (NOTE) Calculated using the CKD-EPI Creatinine Equation (2021)   . Anion gap 10/30/2020 9  5 - 15 Final   Performed at [facility], [place] [address], [place], [place] [address]  . WBC 10/30/2020 7.0  4.0 - 10.5 K/uL Final  . RBC 10/30/2020 4.43  3.87 - 5.11 MIL/uL Final  . Hemoglobin 10/30/2020 12.6  12.0 - 15.0 g/dL Final  . HCT 10/30/2020 38.4  36.0 - 46.0 % Final  . MCV 10/30/2020 86.7  80.0 - 100.0 fL Final  . MCH 10/30/2020 28.4  26.0 - 34.0 pg Final  . MCHC 10/30/2020 32.8  30.0 - 36.0 g/dL Final  . RDW 10/30/2020 12.5  11.5 - 15.5 % Final  . Platelets 10/30/2020 289  150 - 400 K/uL Final  . nRBC 10/30/2020 0.0  0.0 - 0.2 % Final   Performed at [facility], [place] [address] [place], [place] [address]  . Opiates 10/30/2020 NONE DETECTED  NONE DETECTED Final  . Cocaine  10/30/2020 NONE DETECTED  NONE DETECTED Final  . Benzodiazepines 10/30/2020 POSITIVE*  NONE DETECTED Final  . Amphetamines 10/30/2020 POSITIVE* NONE DETECTED Final  . Tetrahydrocannabinol 10/30/2020 NONE DETECTED  NONE DETECTED Final  . Barbiturates 10/30/2020 NONE DETECTED  NONE DETECTED Final   Comment: (NOTE) DRUG SCREEN FOR MEDICAL PURPOSES ONLY.  IF CONFIRMATION IS NEEDED FOR ANY PURPOSE, NOTIFY LAB WITHIN 5 DAYS.  LOWEST DETECTABLE LIMITS FOR URINE DRUG SCREEN Drug Class                     Cutoff (ng/mL) Amphetamine and metabolites    1000 Barbiturate and metabolites    200 Benzodiazepine                 [id number] Tricyclics and metabolites     300 Opiates and metabolites        300 Cocaine and metabolites        300 THC                            50 Performed at [facility], [place] [address], [place], [place] [address]   . I-stat hCG, quantitative 10/30/2020 <5.0  <5 mIU/mL Final  . Comment 3 10/30/2020          Final   Comment:   GEST. AGE      CONC.  (mIU/mL)   <=1 WEEK        5 - 50     2 WEEKS       50 - 500     3 WEEKS       100 - 10,000     4 WEEKS     1,000 - 30,000        FEMALE AND NON-PREGNANT FEMALE:     LESS THAN 5 mIU/mL   . SARS Coronavirus 2 by RT PCR 10/30/2020 NEGATIVE  NEGATIVE Final   Comment: (NOTE) SARS-CoV-2 target nucleic acids are NOT DETECTED.  The SARS-CoV-2 RNA is generally detectable in upper respiratory specimens during the acute phase of infection. The lowest concentration of SARS-CoV-2 viral copies this assay can detect is 138 copies/mL. A negative result does not preclude SARS-Cov-2 infection and should not be used as the sole basis for treatment or other patient management decisions. A negative result may occur with  improper specimen collection/handling, submission of specimen other than nasopharyngeal swab, presence of viral mutation(s) within the areas targeted by this assay, and inadequate number of  viral copies(<138 copies/mL). A negative result must be combined with clinical observations, patient history, and epidemiological information. The expected result is Negative.  Fact Sheet for Patients:  EntrepreneurPulse.com.au  Fact Sheet for Healthcare Providers:  IncredibleEmployment.be  This test is no                          t yet approved or cleared by the [place] FDA and  has been authorized for detection and/or diagnosis of SARS-CoV-2 by FDA under an Emergency Use Authorization (EUA). This EUA will remain  in effect (meaning this test can be used) for the duration of the COVID-19 declaration under Section 564(b)(1) of the Act, 21 U.S.C.section 360bbb-3(b)(1), unless the authorization is terminated  or revoked sooner.      . Influenza A by PCR 10/30/2020 NEGATIVE  NEGATIVE Final  . Influenza B by PCR 10/30/2020 NEGATIVE  NEGATIVE Final   Comment: (NOTE) The Xpert Xpress SARS-CoV-2/FLU/RSV plus assay is intended as an aid in the diagnosis of  influenza from Nasopharyngeal swab specimens and should not be used as a sole basis for treatment. Nasal washings and aspirates are unacceptable for Xpert Xpress SARS-CoV-2/FLU/RSV testing.  Fact Sheet for Patients: EntrepreneurPulse.com.au  Fact Sheet for Healthcare Providers: IncredibleEmployment.be  This test is not yet approved or cleared by the [place] FDA and has been authorized for detection and/or diagnosis of SARS-CoV-2 by FDA under an Emergency Use Authorization (EUA). This EUA will remain in effect (meaning this test can be used) for the duration of the COVID-19 declaration under Section 564(b)(1) of the Act, 21 U.S.C. section 360bbb-3(b)(1), unless the authorization is terminated or revoked.  Performed at [facility], [place] [address] [place], [place] [address]   . Acetaminophen (Tylenol), Serum 10/30/2020 <10* 10  - 30 ug/mL Final   Comment: (NOTE) Therapeutic concentrations vary significantly. A range of 10-30 ug/mL  may be an effective concentration for many patients. However, some  are best treated at concentrations outside of this range. Acetaminophen concentrations >150 ug/mL at 4 hours after ingestion  and >50 ug/mL at 12 hours after ingestion are often associated with  toxic reactions.  Performed at [facility], [place] [address] [place], [place] [address]   . Alcohol, Ethyl (B) 10/30/2020 <10  <10 mg/dL Final   Comment: (NOTE) Lowest detectable limit for serum alcohol is 10 mg/dL.  For medical purposes only. Performed at [facility], [place] [address], [place], [place] [address]   . Salicylate Lvl 93/71/6967 <7.0* 7.0 - 30.0 mg/dL Final   Performed at [place] [address] [place], [place] [address]  . Magnesium 10/30/2020 2.0  1.7 - 2.4 mg/dL Final   Performed at [place] [address] [place], [place] [address]  . Acetaminophen (Tylenol), Serum 10/30/2020 <10* 10 - 30 ug/mL Final   Comment: (NOTE) Therapeutic concentrations vary significantly. A range of 10-30 ug/mL  may be an effective concentration for many patients. However, some  are best treated at concentrations outside of this range. Acetaminophen concentrations >150 ug/mL at 4 hours after ingestion  and >50 ug/mL at 12 hours after ingestion are often associated with  toxic reactions.  Performed at [facility], [place] [address], [place], [place] [address]   Admission on 10/29/2020, Discharged on 10/29/2020  Component Date Value Ref Range Status  . SARS Coronavirus 2 by RT PCR 10/29/2020 NEGATIVE  NEGATIVE Final   Comment: (NOTE) SARS-CoV-2 target nucleic acids are NOT DETECTED.  The SARS-CoV-2 RNA is generally detectable in upper respiratory specimens during the acute phase of infection. The lowest concentration  of SARS-CoV-2 viral copies this assay can detect is 138 copies/mL. A negative result does not preclude SARS-Cov-2 infection and should not be used as the sole basis for treatment or other patient management decisions. A negative result may occur with  improper specimen collection/handling, submission of specimen other than nasopharyngeal swab, presence of viral mutation(s) within the areas targeted by this assay, and inadequate number of viral copies(<138 copies/mL). A negative result must be combined with clinical observations, patient history, and epidemiological information. The expected result is Negative.  Fact Sheet for Patients:  EntrepreneurPulse.com.au  Fact Sheet for Healthcare Providers:  IncredibleEmployment.be  This test is no                          t yet approved or cleared by the [place] and  has been authorized for  detection and/or diagnosis of SARS-CoV-2 by FDA under an Emergency Use Authorization (EUA). This EUA will remain  in effect (meaning this test can be used) for the duration of the COVID-19 declaration under Section 564(b)(1) of the Act, 21 U.S.C.section 360bbb-3(b)(1), unless the authorization is terminated  or revoked sooner.      . Influenza A by PCR 10/29/2020 NEGATIVE  NEGATIVE Final  . Influenza B by PCR 10/29/2020 NEGATIVE  NEGATIVE Final   Comment: (NOTE) The Xpert Xpress SARS-CoV-2/FLU/RSV plus assay is intended as an aid in the diagnosis of influenza from Nasopharyngeal swab specimens and should not be used as a sole basis for treatment. Nasal washings and aspirates are unacceptable for Xpert Xpress SARS-CoV-2/FLU/RSV testing.  Fact Sheet for Patients: EntrepreneurPulse.com.au  Fact Sheet for Healthcare Providers: IncredibleEmployment.be  This test is not yet approved or cleared by the [place] FDA and has been authorized for detection and/or diagnosis  of SARS-CoV-2 by FDA under an Emergency Use Authorization (EUA). This EUA will remain in effect (meaning this test can be used) for the duration of the COVID-19 declaration under Section 564(b)(1) of the Act, 21 U.S.C. section 360bbb-3(b)(1), unless the authorization is terminated or revoked.  Performed at [facility], [place] [address], [place], [place] [address]   . SARS Coronavirus 2 Ag 10/29/2020 Negative  Negative Preliminary  . WBC 10/30/2020 7.9  4.0 - 10.5 K/uL Final  . RBC 10/30/2020 4.55  3.87 - 5.11 MIL/uL Final  . Hemoglobin 10/30/2020 12.7  12.0 - 15.0 g/dL Final  . HCT 10/30/2020 40.1  36.0 - 46.0 % Final  . MCV 10/30/2020 88.1  80.0 - 100.0 fL Final  . MCH 10/30/2020 27.9  26.0 - 34.0 pg Final  . MCHC 10/30/2020 31.7  30.0 - 36.0 g/dL Final  . RDW 10/30/2020 12.7  11.5 - 15.5 % Final  . Platelets 10/30/2020 342  150 - 400 K/uL Final  . nRBC 10/30/2020 0.0  0.0 - 0.2 % Final  . Neutrophils Relative % 10/30/2020 68  % Final  . Neutro Abs 10/30/2020 5.4  1.7 - 7.7 K/uL Final  . Lymphocytes Relative 10/30/2020 22  % Final  . Lymphs Abs 10/30/2020 1.8  0.7 - 4.0 K/uL Final  . Monocytes Relative 10/30/2020 7  % Final  . Monocytes Absolute 10/30/2020 0.6  0.1 - 1.0 K/uL Final  . Eosinophils Relative 10/30/2020 2  % Final  . Eosinophils Absolute 10/30/2020 0.2  0.0 - 0.5 K/uL Final  . Basophils Relative 10/30/2020 1  % Final  . Basophils Absolute 10/30/2020 0.0  0.0 - 0.1 K/uL Final  . Immature Granulocytes 10/30/2020 0  % Final  . Abs Immature Granulocytes 10/30/2020 0.03  0.00 - 0.07 K/uL Final   Performed at [facility], [place] [address], [place], [place] [address]  . Sodium 10/30/2020 138  135 - 145 mmol/L Final  . Potassium 10/30/2020 4.2  3.5 - 5.1 mmol/L Final  . Chloride 10/30/2020 102  98 - 111 mmol/L Final  . CO2 10/30/2020 27  22 - 32 mmol/L Final  . Glucose, Bld 10/30/2020 83  70 - 99 mg/dL Final   Glucose reference range applies only to samples  taken after fasting for at least 8 hours.  . BUN 10/30/2020 10  6 - 20 mg/dL Final  . Creatinine, Ser 10/30/2020 0.68  0.44 - 1.00 mg/dL Final  . Calcium 10/30/2020 9.3  8.9 - 10.3 mg/dL Final  . Total Protein 10/30/2020 7.2  6.5 -  8.1 g/dL Final  . Albumin 10/30/2020 4.1  3.5 - 5.0 g/dL Final  . AST 10/30/2020 13* 15 - 41 U/L Final  . ALT 10/30/2020 11  0 - 44 U/L Final  . Alkaline Phosphatase 10/30/2020 33* 38 - 126 U/L Final  . Total Bilirubin 10/30/2020 0.3  0.3 - 1.2 mg/dL Final  . GFR, Estimated 10/30/2020 >60  >60 mL/min Final   Comment: (NOTE) Calculated using the CKD-EPI Creatinine Equation (2021)   . Anion gap 10/30/2020 9  5 - 15 Final   Performed at [place] [address], [place], [place] [address]  . Hgb A1c MFr Bld 10/30/2020 5.3  4.8 - 5.6 % Final   Comment: (NOTE) Pre diabetes:          5.7%-6.4%  Diabetes:              >6.4%  Glycemic control for   <7.0% adults with diabetes   . Mean Plasma Glucose 10/30/2020 105.41  mg/dL Final   Performed at [place] [address], [place], [place] [address]  . Alcohol, Ethyl (B) 10/30/2020 <10  <10 mg/dL Final   Comment: (NOTE) Lowest detectable limit for serum alcohol is 10 mg/dL.  For medical purposes only. Performed at [facility], [place] [address], [place], [place] [address]   . TSH 10/30/2020 1.473  0.350 - 4.500 uIU/mL Final   Comment: Performed by a 3rd Generation assay with a functional sensitivity of <=0.01 uIU/mL. Performed at [facility], [place] [address], [place], [place] [address]   . Cholesterol 10/30/2020 209* 0 - 200 mg/dL Final  . Triglycerides 10/30/2020 99  <150 mg/dL Final  . HDL 10/30/2020 55  >40 mg/dL Final  . Total CHOL/HDL Ratio 10/30/2020 3.8  RATIO Final  . VLDL 10/30/2020 20  0 - 40 mg/dL Final  . LDL Cholesterol 10/30/2020 134* 0 - 99 mg/dL Final   Comment:        Total Cholesterol/HDL:CHD Risk Coronary Heart Disease Risk Table                     Men    Women  1/2 Average Risk   3.4   3.3  Average Risk       5.0   4.4  2 X Average Risk   9.6   7.1  3 X Average Risk  23.4   11.0        Use the calculated Patient Ratio above and the CHD Risk Table to determine the patient's CHD Risk.        ATP III CLASSIFICATION (LDL):  <100     mg/dL   Optimal  100-129  mg/dL   Near or Above                    Optimal  130-159  mg/dL   Borderline  160-189  mg/dL   High  >190     mg/dL   Very High Performed at [place] [address] [place], [place] [address]   . POC Amphetamine UR 10/29/2020 Positive* NONE DETECTED (Cut Off Level 1000 ng/mL) Final  . POC Secobarbital (BAR) 10/29/2020 None Detected  NONE DETECTED (Cut Off Level 300 ng/mL) Final  . POC Buprenorphine (BUP) 10/29/2020 None Detected  NONE DETECTED (Cut Off Level 10 ng/mL) Final  . POC Oxazepam (BZO) 10/29/2020 Positive* NONE DETECTED (Cut Off Level 300 ng/mL) Final  . POC Cocaine  UR 10/29/2020 None Detected  NONE DETECTED (Cut Off Level 300 ng/mL) Final  . POC Methamphetamine UR 10/29/2020 None Detected  NONE DETECTED (Cut Off Level 1000 ng/mL) Final  . POC Morphine 10/29/2020 None Detected  NONE DETECTED (Cut Off Level 300 ng/mL) Final  . POC Oxycodone UR 10/29/2020 None Detected  NONE DETECTED (Cut Off Level 100 ng/mL) Final  . POC Methadone UR 10/29/2020 None Detected  NONE DETECTED (Cut Off Level 300 ng/mL) Final  . POC Marijuana UR 10/29/2020 Positive* NONE DETECTED (Cut Off Level 50 ng/mL) Final  . SARSCOV2ONAVIRUS 2 AG 10/29/2020 NEGATIVE  NEGATIVE Final   Comment: (NOTE) SARS-CoV-2 antigen NOT DETECTED.   Negative results are presumptive.  Negative results do not preclude SARS-CoV-2 infection and should not be used as the sole basis for treatment or other patient management decisions, including infection  control decisions, particularly in the presence of clinical signs and  symptoms consistent with COVID-19, or in those who have been in contact with the virus.   Negative results must be combined with clinical observations, patient history, and epidemiological information. The expected result is Negative.  Fact Sheet for Patients: HandmadeRecipes.com.cy  Fact Sheet for Healthcare Providers: FuneralLife.at  This test is not yet approved or cleared by the [place] FDA and  has been authorized for detection and/or diagnosis of SARS-CoV-2 by FDA under an Emergency Use Authorization (EUA).  This EUA will remain in effect (meaning this test can be used) for the duration of  the COV                          ID-19 declaration under Section 564(b)(1) of the Act, 21 U.S.C. section 360bbb-3(b)(1), unless the authorization is terminated or revoked sooner.    . Preg Test, Ur 10/29/2020 NEGATIVE  NEGATIVE Final   Comment:        THE SENSITIVITY OF THIS METHODOLOGY IS >24 mIU/mL   Clinical Support on 10/14/2020  Component Date Value Ref Range Status  . Glucose, Bld 10/14/2020 72  65 - 99 mg/dL Final   Comment: .            Fasting reference interval .   . BUN 10/14/2020 14  7 - 25 mg/dL Final  . Creat 10/14/2020 0.80  0.50 - 1.10 mg/dL Final  . BUN/Creatinine Ratio [id number] NOT APPLICABLE  6 - 22 (calc) Final  . Sodium 10/14/2020 141  135 - 146 mmol/L Final  . Potassium 10/14/2020 4.3  3.5 - 5.3 mmol/L Final  . Chloride 10/14/2020 103  98 - 110 mmol/L Final  . CO2 10/14/2020 17* 20 - 32 mmol/L Final  . Calcium 10/14/2020 9.6  8.6 - 10.2 mg/dL Final  . Total Protein 10/14/2020 7.6  6.1 - 8.1 g/dL Final  . Albumin 10/14/2020 4.4  3.6 - 5.1 g/dL Final  . Globulin 10/14/2020 3.2  1.9 - 3.7 g/dL (calc) Final  . AG Ratio 10/14/2020 1.4  1.0 - 2.5 (calc) Final  . Total Bilirubin 10/14/2020 0.5  0.2 - 1.2 mg/dL Final  . Alkaline phosphatase (APISO) 10/14/2020 38  31 - 125 U/L Final  . AST 10/14/2020 14  10 - 30 U/L Final  . ALT 10/14/2020 8  6 - 29 U/L Final  . Phosphorus 10/14/2020 4.0  2.5 - 4.5  mg/dL Final  . Magnesium 10/14/2020 1.9  1.5 - 2.5 mg/dL Final  Office Visit on 10/11/2020  Component Date Value Ref Range Status  . Preg Test,  Ur 10/11/2020 Negative  Negative Final  . Color, UA 10/11/2020 yellow   Final  . Clarity, UA 10/11/2020 clear   Final  . Glucose, UA 10/11/2020 Negative  Negative Final  . Bilirubin, UA 10/11/2020 neg   Final  . Ketones, UA 10/11/2020 trace   Final  . Spec Grav, UA 10/11/2020 1.015  1.010 - 1.025 Final  . Blood, UA 10/11/2020 neg   Final  . pH, UA 10/11/2020 6.0  5.0 - 8.0 Final  . Protein, UA 10/11/2020 Negative  Negative Final  . Urobilinogen, UA 10/11/2020 1.0  0.2 or 1.0 E.U./dL Final  . Nitrite, UA 10/11/2020 neg   Final  . Leukocytes, UA 10/11/2020 Small (1+)* Negative Final  . Glucose, Bld 10/11/2020 CANCELED   Final   Comment: TEST NOT PERFORMED . Quantity not sufficient.  Result canceled by the ancillary.   . Neisseria Gonorrhea 10/11/2020 Negative   Final  . Chlamydia 10/11/2020 Negative   Final  . Trichomonas 10/11/2020 Negative   Final  . Comment 10/11/2020 Normal Reference Range Trichomonas - Negative   Final  . Comment 10/11/2020 Normal Reference Ranger Chlamydia - Negative   Final  . Comment 10/11/2020 Normal Reference Range Neisseria Gonorrhea - Negative   Final  . HIV 1&2 Ab, 4th Generation 10/11/2020 NON-REACTIVE  NON-REACTI Final   Comment: HIV-1 antigen and HIV-1/HIV-2 antibodies were not detected. There is no laboratory evidence of HIV infection. Marland Kitchen PLEASE NOTE: This information has been disclosed to you from records whose confidentiality may be protected by state law.  If your state requires such protection, then the state law prohibits you from making any further disclosure of the information without the specific written consent of the person to whom it pertains, or as otherwise permitted by law. A general authorization for the release of medical or other information is NOT sufficient for this purpose. . For  additional information please refer to http://education.questdiagnostics.com/faq/FAQ106 (This link is being provided for informational/ educational purposes only.) . Marland Kitchen The performance of this assay has not been clinically validated in patients less than 30 years old. .   . RPR Ser Ql 10/11/2020 NON-REACTIVE  NON-REACTI Final  Office Visit on 05/20/2020  Component Date Value Ref Range Status  . Color, UA 05/20/2020 yellow   Final  . Clarity, UA 05/20/2020 clear   Final  . Glucose, UA 05/20/2020 Negative  Negative Final  . Bilirubin, UA 05/20/2020 neg   Final  . Ketones, UA 05/20/2020 neg   Final  . Spec Grav, UA 05/20/2020 1.025  1.010 - 1.025 Final  . Blood, UA 05/20/2020 neg   Final  . pH, UA 05/20/2020 5.0  5.0 - 8.0 Final  . Protein, UA 05/20/2020 Positive* Negative Final  . Urobilinogen, UA 05/20/2020 negative* 0.2 or 1.0 E.U./dL Final  . Nitrite, UA 05/20/2020 neg   Final  . Leukocytes, UA 05/20/2020 Negative  Negative Final  . Appearance 05/20/2020 norm   Final  . Odor 05/20/2020 norm   Final    Allergies: Mango flavor, Red dye, and Shellfish allergy  PTA Medications: (Not in a hospital admission)   Medical Decision Making  Admit patient to [facility] for continuous assessment for safety.  -continue home medication  -review labs     Recommendations  Based on my evaluation the patient does not appear to have an emergency medical condition.  Ophelia Shoulder, NP 10/31/20  3:09 AM

## 2020-10-31 NOTE — ED Notes (Signed)
Patient received After summary visit. Medications discussed and follow up discussed. Patient verbalized understanding to make appointments with Intensive psych outpatient. Patient discharged home,transported home via private automobile with mother.

## 2020-10-31 NOTE — Discharge Instructions (Addendum)
Take all medications as prescribed. Keep all follow-up appointments as scheduled.  Do not consume alcohol or use illegal drugs while on prescription medications. Report any adverse effects from your medications to your primary care provider promptly.  In the event of recurrent symptoms or worsening symptoms, call 911, a crisis hotline, or go to the nearest emergency department for evaluation.   
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
## 2020-10-31 NOTE — ED Notes (Signed)
Pt given apple juice  

## 2020-10-31 NOTE — ED Notes (Signed)
Pt laying  in bed. Calm and cooperative@this  time. Alert and orient x 3. Will continue to monitor for safety

## 2020-10-31 NOTE — ED Provider Notes (Signed)
FBC/OBS ASAP Discharge Summary  Date and Time: 10/31/2020 11:00 AM  Name: Regina Wilkerson  MRN:  [id number]   Discharge Diagnoses:  Final diagnoses:  GAD (generalized anxiety disorder)  Suicidal ideation  Attention deficit hyperactivity disorder (ADHD), combined type  Adjustment disorder with mixed anxiety and depressed mood    Evaluation: Regina Wilkerson was seen and evaluated face-to-face.  She denied suicidal or homicidal ideations.  Denies auditory or visual hallucinations.  Reports she is currently followed by nurse practitioner for medication management and felt overwhelmed yesterday.  Reports taking propranolol 2 pills multiple times throughout the day, to help with anxiety. "  Was not a suicide attempt."  Spoke to patient's mother for additional collateral.   Mother Regina Wilkerson denied any safety concerns as she reports patient has struggled with anxiety for a while.  Reports she was in a verbal altercation with her roommate and became stressed and overwhelmed.  States she was at her boyfriend's house on yesterday because she was attempting to get away from the situation.  Mother reports she resides on the other side of [place] and will be to pick up her daughter 2 hours.  Denies patient has access to guns or weapons in the home.  Offered intensive outpatient program and/or partial hospitalization.  Patient appeared receptive to plan.  Patient to discharge to mother.  Support, encouragement and reassurance was provided.    Per admission assessment note: Regina Wilkerson is a 25 y/o female. Patient presented to [facility] voluntarily with complaint of "increased agitation, withdraw from [name], and stress." Patient report that she recently stopped taking her antidepressant Pristiq due to suicidal ideation.  She report that antidepressants make her suicidal. She report that her last dose was 10/23/2020. She is endorsing passive suicidal ideation and states "I would love for something bad to happen but  that would be inconvenient because I wouldn't be able to finish my school project and classes and its the end of semester. 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 Anxiety    [employer]reesia 12/10/2019  . Eating disorder   . Vitamin D deficiency    No past surgical history on file. Family History: No family history on file. Family Psychiatric History:  Social History:  Social History   Substance and Sexual Activity  Alcohol Use None     Social History   Substance and Sexual Activity  Drug Use Not on file    Social History   Socioeconomic History  . Marital status: Single    Spouse name: Not on file  . Number of children: Not on file  . Years of education: Not on file  . Highest education level: Not on file  Occupational History  . Not on file  Tobacco Use  .  Smoking status: Never Smoker  . Smokeless tobacco: Never Used  Substance and Sexual Activity  . Alcohol use: Not on file  . Drug use: Not on file  . Sexual activity: Not on file  Other Topics Concern  . Not on file  Social History Narrative  . Not on file   Social Determinants of Health   Financial Resource Strain: Not on file  Food Insecurity: Not on file  Transportation Needs: Not on file  Physical Activity: Not on file  Stress: Not on file  Social  Connections: Not on file   SDOH:  SDOH Screenings   Alcohol Screen: Not on file  Depression (PHQ2-9): Medium Risk  . PHQ-2 Score: 12  Financial Resource Strain: Not on file  Food Insecurity: Not on file  Housing: Not on file  Physical Activity: Not on file  Social Connections: Not on file  Stress: Not on file  Tobacco Use: Low Risk   . Smoking Tobacco Use: Never Smoker  . Smokeless Tobacco Use: Never Used  Transportation Needs: Not on file    Has this patient used any form of tobacco in the last 30 days? (Cigarettes, Smokeless Tobacco, Cigars, and/or Pipes) Prescription not provided because: nonsmoker   Current Medications:  Current Facility-Administered Medications  Medication Dose Route Frequency Provider Last Rate Last Admin  . acetaminophen (TYLENOL) tablet 650 mg  650 mg Oral Q6H PRN Ajibola, Ene A, NP      . alum & mag hydroxide-simeth (MAALOX/MYLANTA) 200-200-20 MG/5ML suspension 30 mL  30 mL Oral Q4H PRN Ajibola, Ene A, NP      . amphetamine-dextroamphetamine (ADDERALL) tablet 15 mg  15 mg Oral Daily Ajibola, Ene A, NP   15 mg at 10/31/20 1010  . hydrOXYzine (ATARAX/VISTARIL) tablet 25 mg  25 mg Oral TID PRN Ajibola, Ene A, NP   25 mg at 10/31/20 0256  . lisdexamfetamine (VYVANSE) capsule 60 mg  60 mg Oral BH-q7a Ajibola, Ene A, NP   60 mg at 10/31/20 1009  . magnesium hydroxide (MILK OF MAGNESIA) suspension 30 mL  30 mL Oral Daily PRN Ajibola, Ene A, NP      . pantoprazole (PROTONIX) EC tablet 40 mg  40 mg Oral Daily Ajibola, Ene A, NP   40 mg at 10/31/20 1015  . traZODone (DESYREL) tablet 50 mg  50 mg Oral QHS PRN Ajibola, Ene A, NP   50 mg at 10/31/20 0256   Current Outpatient Medications  Medication Sig Dispense Refill  . fluconazole (DIFLUCAN) 150 MG tablet Take 1 tablet today and 1 tablet 3 days from now 2 tablet 0  . amphetamine-dextroamphetamine (ADDERALL) 15 MG tablet Take 1 tablet by mouth daily. 30 tablet 0  . desvenlafaxine (PRISTIQ) 50 MG 24 hr tablet Take 1  tablet (50 mg total) by mouth daily. 30 tablet 3  . Ferrous Fumarate (HEMOCYTE - 106 MG FE) 324 (106 Fe) MG TABS tablet Take 1 tablet (106 mg of iron total) by mouth daily. 30 tablet 3  . levonorgestrel-ethinyl estradiol (AVIANE) 0.1-20 MG-MCG tablet Take 1 tablet by mouth daily. 84 tablet 3  . lisdexamfetamine (VYVANSE) 60 MG capsule Take 1 capsule (60 mg total) by mouth every morning. 30 capsule 0  . pantoprazole (PROTONIX) 40 MG tablet Take 1 tablet (40 mg total) by mouth daily. 30 tablet 3  . propranolol (INDERAL) 20 MG tablet Take 20 mg by mouth as needed.      PTA Medications: (Not in a hospital admission)   Musculoskeletal  Strength & Muscle Tone: within normal limits Gait & Station: normal Patient leans: N/A  Psychiatric Specialty Exam  Presentation  General Appearance: Appropriate for Environment  Eye Contact:Good  Speech:Clear and Coherent  Speech Volume:Normal  Handedness:Right   Mood and Affect  Mood:Depressed  Affect:Blunt   Thought Process  Thought Processes:No data recorded Descriptions of Associations:Intact  Orientation:Full (Time, Place and Person)  Thought Content:WDL  Diagnosis of Schizophrenia or Schizoaffective disorder in past: No    Hallucinations:Hallucinations: None  Ideas of Reference:None  Suicidal Thoughts:Suicidal Thoughts: No  Homicidal Thoughts:Homicidal Thoughts: No   Sensorium  Memory:Immediate Good; Remote Good; Recent Good  Judgment:Good  Insight:Poor   Executive Functions  Concentration:Good  Attention Span:Good  [place] of Knowledge:Good  Language:Good   Psychomotor Activity  Psychomotor Activity:Psychomotor Activity: Normal   Assets  Assets:Communication Skills; Housing; Data processing manager; Vocational/Educational   Sleep  Sleep:Sleep: Good   Nutritional Assessment (For OBS and FBC admissions only) Has the patient had a weight loss or gain of 10 pounds or more in the last 3 months?:  No Has the patient had a decrease in food intake/or appetite?: No Does the patient have dental problems?: No Does the patient have eating habits or behaviors that may be indicators of an eating disorder including binging or inducing vomiting?: No Has the patient recently lost weight without trying?: No Has the patient been eating poorly because of a decreased appetite?: No Malnutrition Screening Tool Score: 0    Physical Exam  Physical Exam Vitals reviewed.  Cardiovascular:     Rate and Rhythm: Normal rate and regular rhythm.  Neurological:     Mental Status: She is alert.  Psychiatric:        Attention and Perception: Attention normal.        Mood and Affect: Mood normal.        Speech: Speech normal.        Behavior: Behavior normal.        Thought Content: Thought content normal. Thought content does not include suicidal ideation. Thought content does not include suicidal plan.        Cognition and Memory: Cognition normal.        Judgment: Judgment normal.    Review of Systems  Psychiatric/Behavioral: Positive for depression. Negative for suicidal ideas. The patient is nervous/anxious.   All other systems reviewed and are negative.  Blood pressure 105/67, pulse 77, temperature 98.5 F (36.9 C), temperature source Oral, resp. rate 18, last menstrual period 10/30/2020, SpO2 99 %. There is no height or weight on file to calculate BMI.  Demographic Factors:  Adolescent or young adult and Caucasian  Loss Factors: NA  Historical Factors: Impulsivity  Risk Reduction Factors:   Sense of responsibility to family, Living with another person, especially a relative, Positive social support and Positive therapeutic relationship  Continued Clinical Symptoms:  Severe Anxiety and/or Agitation Depression:   Anhedonia  Cognitive Features That Contribute To Risk:  Closed-mindedness and Loss of executive function    Suicide Risk:  Minimal: No identifiable suicidal ideation.   Patients presenting with no risk factors but with morbid ruminations; may be classified as minimal risk based on the severity of the depressive symptoms  Plan Of Care/Follow-up recommendations:  Activity:  as tolorated Diet:  heart heart   Disposition: Take all medications as prescribed. Keep all follow-up appointments as scheduled.  Do not consume alcohol or use illegal drugs while on prescription medications. Report any adverse effects from your medications to your primary  care provider promptly.  In the event of recurrent symptoms or worsening symptoms, call 911, a crisis hotline, or go to the nearest emergency department for evaluation.   Derrill Center, NP 10/31/2020, 11:00 AM

## 2020-10-31 NOTE — ED Notes (Signed)
Pt A&O x 4, under IVC, presents with complaint of suicidal ideation, pt took triple amount of medication in attempt to kill self.  Denies HI, or AVH.  Skin search completed, Monitoring for safety.

## 2020-11-01 ENCOUNTER — Ambulatory Visit: Payer: No Typology Code available for payment source | Admitting: Pediatrics

## 2020-11-02 ENCOUNTER — Encounter: Payer: Self-pay | Admitting: Pediatrics

## 2020-11-02 ENCOUNTER — Ambulatory Visit (INDEPENDENT_AMBULATORY_CARE_PROVIDER_SITE_OTHER): Payer: No Typology Code available for payment source | Admitting: Family

## 2020-11-02 ENCOUNTER — Other Ambulatory Visit: Payer: Self-pay

## 2020-11-02 VITALS — Ht 62.6 in | Wt 175.4 lb

## 2020-11-02 DIAGNOSIS — F4323 Adjustment disorder with mixed anxiety and depressed mood: Secondary | ICD-10-CM

## 2020-11-02 DIAGNOSIS — Z3202 Encounter for pregnancy test, result negative: Secondary | ICD-10-CM | POA: Diagnosis not present

## 2020-11-02 DIAGNOSIS — F902 Attention-deficit hyperactivity disorder, combined type: Secondary | ICD-10-CM

## 2020-11-02 DIAGNOSIS — F5002 Anorexia nervosa, binge eating/purging type: Secondary | ICD-10-CM | POA: Diagnosis not present

## 2020-11-02 DIAGNOSIS — R10819 Abdominal tenderness, unspecified site: Secondary | ICD-10-CM

## 2020-11-02 LAB — POCT URINE PREGNANCY: Preg Test, Ur: NEGATIVE

## 2020-11-02 MED ORDER — HYDROXYZINE HCL 25 MG PO TABS: 25.0000 mg | ORAL_TABLET | Freq: Three times a day (TID) | ORAL | 1 refills | Status: DC | PRN

## 2020-11-02 NOTE — Progress Notes (Signed)
History was provided by the patient.  Regina Wilkerson is a 25 y.o. female who is here for adjustment disorder with mixed anxiety and depressed mood, ADHD, combined type, Anorexia nervosa, b/p.  PCP confirmed? No.    HPI:    Goal for today: something for anxiety; life only went crazy because of coming off Pristiq  -reason for coming off: SI, but worse once off the medicine; last dose was last Saturday in April  -Appetite: once per day meal, really just hanging out with BF and he will suggest eating -water intake about 20 ounces per day  -Sleep irregular:  -leaving [place] for [facility]  -before downfall was purging but none since admission  -leading up to downfall moment hadn't eaten in 5 days  -took cymbalta and come down was terrible -tried lexapro, wasn't sure it it helped or if Pristiq was just terrible  -school: Education administrator, theatre  -safe to herself today  Patient Active Problem List   Diagnosis Date Noted  . Elevated blood-pressure reading without diagnosis of hypertension 04/08/2020  . Gastroesophageal reflux disease 01/07/2020  . Anorexia nervosa, binge eating/purging type 10/15/2019  . Slow transit constipation 10/15/2019  . Adjustment disorder with mixed anxiety and depressed mood 10/15/2019  . Sleep disturbance 10/15/2019  . Chronic nonintractable headache 10/15/2019  . Attention deficit hyperactivity disorder (ADHD), combined type 10/15/2019    Current Outpatient Medications on File Prior to Visit  Medication Sig Dispense Refill  . amphetamine-dextroamphetamine (ADDERALL) 15 MG tablet Take 1 tablet by mouth daily. 30 tablet 0  . fluconazole (DIFLUCAN) 150 MG tablet Take 1 tablet today and 1 tablet 3 days from now (Patient not taking: Reported on 11/02/2020) 2 tablet 0  . levonorgestrel-ethinyl estradiol (AVIANE) 0.1-20 MG-MCG tablet Take 1 tablet by mouth daily. 84 tablet 3  . lisdexamfetamine (VYVANSE) 60 MG capsule Take 1 capsule (60 mg total) by  mouth every morning. 30 capsule 0  . pantoprazole (PROTONIX) 40 MG tablet Take 1 tablet (40 mg total) by mouth daily. 30 tablet 3  . propranolol (INDERAL) 20 MG tablet Take 20 mg by mouth as needed.    . desvenlafaxine (PRISTIQ) 50 MG 24 hr tablet Take 1 tablet (50 mg total) by mouth daily. (Patient not taking: Reported on 11/02/2020) 30 tablet 3  . Ferrous Fumarate (HEMOCYTE - 106 MG FE) 324 (106 Fe) MG TABS tablet Take 1 tablet (106 mg of iron total) by mouth daily. (Patient not taking: Reported on 11/02/2020) 30 tablet 3   No current facility-administered medications on file prior to visit.    Allergies  Allergen Reactions  . Mango Flavor   . Red Dye   . Shellfish Allergy     Physical Exam:      Vitals:   11/02/20 1508  Weight: 175 lb 6.4 oz (79.6 kg)  Height: 5' 2.6" (1.59 m)    Growth percentile SmartLinks can only be used for patients less than 65 years old. Patient's last menstrual period was 10/30/2020.  Physical Exam Vitals reviewed.  HENT:     Head: Normocephalic.     Mouth/Throat:     Pharynx: Oropharynx is clear.  Eyes:     General: No scleral icterus.    Pupils: Pupils are equal, round, and reactive to light.  Cardiovascular:     Rate and Rhythm: Normal rate and regular rhythm.  Pulmonary:     Effort: Pulmonary effort is normal.  Abdominal:     General: Bowel sounds are normal.  Palpations: Abdomen is soft.     Tenderness: There is abdominal tenderness in the suprapubic area.  Musculoskeletal:        General: No swelling. Normal range of motion.     Cervical back: Normal range of motion.  Lymphadenopathy:     Cervical: No cervical adenopathy.  Skin:    General: Skin is warm and dry.     Capillary Refill: Capillary refill takes less than 2 seconds.     Findings: No rash.  Neurological:     General: No focal deficit present.     Mental Status: She is alert and oriented to person, place, and time.     Motor: No tremor.  Psychiatric:        Mood and  Affect: Affect is flat.     PHQ-SADS Last 3 Score only 11/02/2020 10/11/2020 03/02/2020  PHQ-15 Score 15 13 -  Total GAD-7 Score 19 18 -  PHQ-9 Total Score 15 12 17     Assessment/Plan:  25 yo female presents s/p [place] encounter on 10/31/20 for attempted suicide by ingestion of  propranolol 20 mg x 6 tablets + 1 klonopin. She denies SI today and is safe to self. Would like something for anxiety; recommended genesight testing to guide medication management, referral to psychiatry. CIDI-based bipolar disorder screening scale today was negative.   1. Adjustment disorder with mixed anxiety and depressed mood 2. Attention deficit hyperactivity disorder (ADHD), combined type 3. Anorexia nervosa, binge eating/purging type 4. Negative pregnancy test -POCT urine pregnancy 5. Suprapubic tenderness - Urinalysis, dipstick only

## 2020-11-03 LAB — TIQ-MISC

## 2020-11-11 ENCOUNTER — Other Ambulatory Visit: Payer: Self-pay

## 2020-11-11 DIAGNOSIS — F902 Attention-deficit hyperactivity disorder, combined type: Secondary | ICD-10-CM

## 2020-11-12 MED ORDER — AMPHETAMINE-DEXTROAMPHETAMINE 15 MG PO TABS: 15.0000 mg | ORAL_TABLET | Freq: Every day | ORAL | 0 refills | Status: DC

## 2020-11-12 MED ORDER — LISDEXAMFETAMINE DIMESYLATE 60 MG PO CAPS: 60.0000 mg | ORAL_CAPSULE | ORAL | 0 refills | Status: DC

## 2020-11-12 MED ORDER — FLUCONAZOLE 150 MG PO TABS: ORAL_TABLET | ORAL | 0 refills | Status: DC

## 2020-11-19 ENCOUNTER — Other Ambulatory Visit: Payer: Self-pay

## 2020-11-19 ENCOUNTER — Ambulatory Visit: Payer: No Typology Code available for payment source

## 2020-12-13 ENCOUNTER — Other Ambulatory Visit: Payer: Self-pay

## 2020-12-13 DIAGNOSIS — F902 Attention-deficit hyperactivity disorder, combined type: Secondary | ICD-10-CM

## 2020-12-15 MED ORDER — AMPHETAMINE-DEXTROAMPHETAMINE 15 MG PO TABS: 15.0000 mg | ORAL_TABLET | Freq: Every day | ORAL | 0 refills | Status: DC

## 2020-12-15 MED ORDER — LISDEXAMFETAMINE DIMESYLATE 60 MG PO CAPS: 60.0000 mg | ORAL_CAPSULE | ORAL | 0 refills | Status: DC

## 2020-12-23 ENCOUNTER — Other Ambulatory Visit: Payer: Self-pay | Admitting: Pediatrics

## 2020-12-23 MED ORDER — TACROLIMUS 0.1 % EX OINT: TOPICAL_OINTMENT | Freq: Two times a day (BID) | CUTANEOUS | 0 refills | Status: DC

## 2020-12-24 ENCOUNTER — Other Ambulatory Visit: Payer: Self-pay

## 2020-12-25 ENCOUNTER — Other Ambulatory Visit: Payer: Self-pay | Admitting: Pediatrics

## 2020-12-26 ENCOUNTER — Telehealth: Payer: No Typology Code available for payment source | Admitting: Physician Assistant

## 2020-12-26 ENCOUNTER — Encounter: Payer: Self-pay | Admitting: Physician Assistant

## 2020-12-26 DIAGNOSIS — N390 Urinary tract infection, site not specified: Secondary | ICD-10-CM

## 2020-12-26 DIAGNOSIS — B3731 Acute candidiasis of vulva and vagina: Secondary | ICD-10-CM

## 2020-12-26 DIAGNOSIS — B373 Candidiasis of vulva and vagina: Secondary | ICD-10-CM

## 2020-12-26 DIAGNOSIS — M549 Dorsalgia, unspecified: Secondary | ICD-10-CM

## 2020-12-26 MED ORDER — FLUCONAZOLE 150 MG PO TABS: 150.0000 mg | ORAL_TABLET | Freq: Once | ORAL | 0 refills | Status: AC

## 2020-12-26 NOTE — Progress Notes (Unsigned)
E-Visit for Vaginal Symptoms  We are sorry that you are not feeling well. Here is how we plan to help! Based on what you shared with me it looks like you: May have a yeast vaginosis  Vaginosis is an inflammation of the vagina that can result in discharge, itching and pain. The cause is usually a change in the normal balance of vaginal bacteria or an infection. Vaginosis can also result from reduced estrogen levels after menopause.  The most common causes of vaginosis are:   Bacterial vaginosis which results from an overgrowth of one on several organisms that are normally present in your vagina.   Yeast infections which are caused by a naturally occurring fungus called candida.   Vaginal atrophy (atrophic vaginosis) which results from the thinning of the vagina from reduced estrogen levels after menopause.   Trichomoniasis which is caused by a parasite and is commonly transmitted by sexual intercourse.  Factors that increase your risk of developing vaginosis include: Medications, such as antibiotics and steroids Uncontrolled diabetes Use of hygiene products such as bubble bath, vaginal spray or vaginal deodorant Douching Wearing damp or tight-fitting clothing Using an intrauterine device (IUD) for birth control Hormonal changes, such as those associated with pregnancy, birth control pills or menopause Sexual activity Having a sexually transmitted infection  Your treatment plan is A single Diflucan (fluconazole) 150mg  tablet once.  I have electronically sent this prescription into the pharmacy that you have chosen.   Regina Wilkerson For your symptoms of UTI and back pain, your should have an urgent face to face visit for further evaluation. On certain occasions, a simple UTI may become more complicated and turn into kidney infection, in which back pain or flank pain would be a sign of it. You indicated that you will follow up urgently for these symptoms.    Be sure to take all of the  medication as directed. Stop taking any medication if you develop a rash, tongue swelling or shortness of breath. Mothers who are breast feeding should consider pumping and discarding their breast milk while on these antibiotics. However, there is no consensus that infant exposure at these doses would be harmful.  Remember that medication creams can weaken latex condoms. Marland Kitchen   HOME CARE:  Good hygiene may prevent some types of vaginosis from recurring and may relieve some symptoms:  Avoid baths, hot tubs and whirlpool spas. Rinse soap from your outer genital area after a shower, and dry the area well to prevent irritation. Don't use scented or harsh soaps, such as those with deodorant or antibacterial action. Avoid irritants. These include scented tampons and pads. Wipe from front to back after using the toilet. Doing so avoids spreading fecal bacteria to your vagina.  Other things that may help prevent vaginosis include:  Don't douche. Your vagina doesn't require cleansing other than normal bathing. Repetitive douching disrupts the normal organisms that reside in the vagina and can actually increase your risk of vaginal infection. Douching won't clear up a vaginal infection. Use a latex condom. Both female and female latex condoms may help you avoid infections spread by sexual contact. Wear cotton underwear. Also wear pantyhose with a cotton crotch. If you feel comfortable without it, skip wearing underwear to bed. Yeast thrives in [employer] Your symptoms should improve in the next day or two.  GET HELP RIGHT AWAY IF:  You have pain in your lower abdomen ( pelvic area or over your ovaries) You develop nausea or vomiting You develop a  fever Your discharge changes or worsens You have persistent pain with intercourse You develop shortness of breath, a rapid pulse, or you faint.  These symptoms could be signs of problems or infections that need to be evaluated by a medical provider  now.  MAKE SURE YOU   Understand these instructions. Will watch your condition. Will get help right away if you are not doing well or get worse.  Thank you for choosing an e-visit.  Your e-visit answers were reviewed by a board certified advanced clinical practitioner to complete your personal care plan. Depending upon the condition, your plan could have included both over the counter or prescription medications.  Please review your pharmacy choice. Make sure the pharmacy is open so you can pick up prescription now. If there is a problem, you may contact your provider through [employer] and have the prescription routed to another pharmacy.  Your safety is important to [place]. If you have drug allergies check your prescription carefully.   For the next 24 hours you can use MyChart to ask questions about today's visit, request a non-urgent call back, or ask for a work or school excuse. You will get an email in the next two days asking about your experience. I hope that your e-visit has been valuable and will speed your recovery. I spent 5-10 minutes on review and completion of this note- Regina Wilkerson [facility]

## 2020-12-27 MED ORDER — FLUCONAZOLE 150 MG PO TABS: ORAL_TABLET | ORAL | 0 refills | Status: DC

## 2021-01-12 ENCOUNTER — Telehealth: Payer: Medicaid Other | Admitting: Family

## 2021-01-16 ENCOUNTER — Other Ambulatory Visit: Payer: Self-pay

## 2021-01-16 DIAGNOSIS — F902 Attention-deficit hyperactivity disorder, combined type: Secondary | ICD-10-CM

## 2021-01-17 MED ORDER — LISDEXAMFETAMINE DIMESYLATE 60 MG PO CAPS
60.0000 mg | ORAL_CAPSULE | ORAL | 0 refills | Status: DC
Start: 2021-01-17 — End: 2021-02-17

## 2021-01-17 MED ORDER — AMPHETAMINE-DEXTROAMPHETAMINE 15 MG PO TABS: 15.0000 mg | ORAL_TABLET | Freq: Every day | ORAL | 0 refills | Status: DC

## 2021-01-19 ENCOUNTER — Other Ambulatory Visit: Payer: Self-pay

## 2021-01-19 ENCOUNTER — Encounter: Payer: Self-pay | Admitting: Family

## 2021-01-19 ENCOUNTER — Telehealth (INDEPENDENT_AMBULATORY_CARE_PROVIDER_SITE_OTHER): Payer: Medicaid Other | Admitting: Family

## 2021-01-19 DIAGNOSIS — Z87440 Personal history of urinary (tract) infections: Secondary | ICD-10-CM | POA: Diagnosis not present

## 2021-01-19 DIAGNOSIS — F4323 Adjustment disorder with mixed anxiety and depressed mood: Secondary | ICD-10-CM | POA: Diagnosis not present

## 2021-01-19 MED ORDER — BUSPIRONE HCL 5 MG PO TABS: 5.0000 mg | ORAL_TABLET | Freq: Two times a day (BID) | ORAL | 0 refills | Status: DC

## 2021-01-19 NOTE — Progress Notes (Signed)
THIS RECORD MAY CONTAIN CONFIDENTIAL INFORMATION THAT SHOULD NOT BE RELEASED WITHOUT REVIEW OF THE SERVICE PROVIDER.  Virtual Follow-Up Visit via Video Note  I connected with Regina Wilkerson  on 01/19/21 at  9:30 AM EDT by a video enabled telemedicine application and verified that I am speaking with the correct person using two identifiers.   Patient/parent location: home   I discussed the limitations of evaluation and management by telemedicine and the availability of in person appointments.  I discussed that the purpose of this telehealth visit is to provide medical care while limiting exposure to the novel coronavirus.  The patient expressed understanding and agreed to proceed.   Regina Wilkerson is a 25 y.o. female referred by No ref. provider found here today for follow-up of adjustment disorder with mixed anxiety and depressed mood.   History was provided by the patient.  Supervising Physician: Dr. Lenore Cordia  Plan from Last Visit:   -Genesight testing   Chief Complaint:   History of Present Illness:  -headaches recently; last vision check was May  -a few weeks ago got really bad headaches - behind both eyes -water intake: 80 oz -appetite/eating: some restricting, purged twice this week, last purge Saturday  -constipation/diarrhea: was constipated and took laxatives Monday or Tuesday   -Shelby (therapist) - found out last week that she is changing offices and is hoping she can stick with her; does not want to start over  -nutritionist: was but ghosted her; saw Donetta.  -LMP: cramping was really intense; not currently on birth control; sexually active, female - wasn't using contraception - kept getting UTIs and yeast infections -  -endorses that she has had 5 UTIs within 5 months, no positive cultures in lab review - negative for nitrites in UAs within last one year in our system. Discussed referral to urology for further work-up.     Allergies  Allergen Reactions   Mango Flavor     Red Dye    Shellfish Allergy    Outpatient Medications Prior to Visit  Medication Sig Dispense Refill   amphetamine-dextroamphetamine (ADDERALL) 15 MG tablet Take 1 tablet by mouth daily. 30 tablet 0   desvenlafaxine (PRISTIQ) 50 MG 24 hr tablet Take 1 tablet (50 mg total) by mouth daily. (Patient not taking: Reported on 11/02/2020) 30 tablet 3   Ferrous Fumarate (HEMOCYTE - 106 MG FE) 324 (106 Fe) MG TABS tablet Take 1 tablet (106 mg of iron total) by mouth daily. (Patient not taking: Reported on 11/02/2020) 30 tablet 3   fluconazole (DIFLUCAN) 150 MG tablet Take 1 tablet today and 1 tablet 3 days from now 2 tablet 0   hydrOXYzine (ATARAX/VISTARIL) 25 MG tablet Take 1 tablet (25 mg total) by mouth 3 (three) times daily as needed. 30 tablet 1   levonorgestrel-ethinyl estradiol (AVIANE) 0.1-20 MG-MCG tablet Take 1 tablet by mouth daily. 84 tablet 3   lisdexamfetamine (VYVANSE) 60 MG capsule Take 1 capsule (60 mg total) by mouth every morning. 30 capsule 0   pantoprazole (PROTONIX) 40 MG tablet Take 1 tablet (40 mg total) by mouth daily. 30 tablet 3   propranolol (INDERAL) 20 MG tablet Take 20 mg by mouth as needed.     tacrolimus (PROTOPIC) 0.1 % ointment Apply topically 2 (two) times daily. 100 g 0   No facility-administered medications prior to visit.     Patient Active Problem List   Diagnosis Date Noted   Elevated blood-pressure reading without diagnosis of hypertension 04/08/2020   Gastroesophageal reflux disease  01/07/2020   Anorexia nervosa, binge eating/purging type 10/15/2019   Slow transit constipation 10/15/2019   Adjustment disorder with mixed anxiety and depressed mood 10/15/2019   Sleep disturbance 10/15/2019   Chronic nonintractable headache 10/15/2019   Attention deficit hyperactivity disorder (ADHD), combined type 10/15/2019     The following portions of the patient's history were reviewed and updated as appropriate: allergies, current medications, past family history,  past medical history, past social history, past surgical history, and problem list.  Visual Observations/Objective:   General Appearance: Well nourished well developed, in no apparent distress.  Eyes: conjunctiva no swelling or erythema ENT/Mouth: No hoarseness, No cough for duration of visit.  Neck: Supple  Respiratory: Respiratory effort normal, normal rate, no retractions or distress.   Cardio: Appears well-perfused, noncyanotic Musculoskeletal: no obvious deformity Skin: visible skin without rashes, ecchymosis, erythema Neuro: Awake and oriented X 3,  Psych:  normal affect, Insight and Judgment appropriate.    Assessment/Plan:  1. Adjustment disorder with mixed anxiety and depressed mood -reviewed genesight testing; considered Effexor vs Buspar; agreeable to try Buspar 5 mg BID and can increase to 10 mg BID. Reviewed expected side effects and return precautions. Continue with therapy.  2. History of recurrent UTIs -unclear hx of symptoms; reviewed contraception; she is pre-contemplative with concern of side effects. Negative gc/c 10/11/20. - Ambulatory referral to Urology   [facility] screenings:  PHQ-SADS Last 3 Score only 11/02/2020 10/11/2020 03/02/2020  PHQ-15 Score 15 13 -  Total GAD-7 Score 19 18 -  PHQ-9 Total Score 15 12 17    Screens discussed with patient and parent and adjustments to plan made accordingly.   I discussed the assessment and treatment plan with the patient and/or parent/guardian.  They were provided an opportunity to ask questions and all were answered.  They agreed with the plan and demonstrated an understanding of the instructions. They were advised to call back or seek an in-person evaluation in the emergency room if the symptoms worsen or if the condition fails to improve as anticipated.   Follow-up:   7/29 or sooner if needed   Medical decision-making:   I spent 30 minutes on this telehealth visit inclusive of face-to-face video and care coordination  time I was located in office during this encounter.   Parthenia Ames, NP    CC: Pcp, No, No ref. provider found

## 2021-01-22 ENCOUNTER — Encounter: Payer: Self-pay | Admitting: Family

## 2021-01-28 ENCOUNTER — Ambulatory Visit: Payer: Medicaid Other | Admitting: Family

## 2021-02-11 ENCOUNTER — Other Ambulatory Visit: Payer: Self-pay

## 2021-02-11 ENCOUNTER — Other Ambulatory Visit (HOSPITAL_COMMUNITY)
Admission: RE | Admit: 2021-02-11 | Discharge: 2021-02-11 | Disposition: A | Discharge status: Home / Self Care | Payer: Medicaid Other | Source: Ambulatory Visit | Attending: Family | Admitting: Family

## 2021-02-11 ENCOUNTER — Encounter: Payer: Self-pay | Admitting: Family

## 2021-02-11 ENCOUNTER — Ambulatory Visit (INDEPENDENT_AMBULATORY_CARE_PROVIDER_SITE_OTHER): Payer: Medicaid Other | Admitting: Family

## 2021-02-11 VITALS — BP 110/71 | HR 61 | Ht 63.0 in | Wt 175.8 lb

## 2021-02-11 DIAGNOSIS — Z113 Encounter for screening for infections with a predominantly sexual mode of transmission: Secondary | ICD-10-CM

## 2021-02-11 DIAGNOSIS — F5002 Anorexia nervosa, binge eating/purging type: Secondary | ICD-10-CM | POA: Diagnosis not present

## 2021-02-11 DIAGNOSIS — Z3202 Encounter for pregnancy test, result negative: Secondary | ICD-10-CM

## 2021-02-11 DIAGNOSIS — K5901 Slow transit constipation: Secondary | ICD-10-CM

## 2021-02-11 LAB — CBC WITH DIFFERENTIAL/PLATELET
Absolute Monocytes: 542 cells/uL (ref 200–950)
Basophils Absolute: 38 cells/uL (ref 0–200)
Basophils Relative: 0.6 %
Eosinophils Absolute: 120 cells/uL (ref 15–500)
Eosinophils Relative: 1.9 %
HCT: 39.2 % (ref 35.0–45.0)
Hemoglobin: 12.8 g/dL (ref 11.7–15.5)
Lymphs Abs: 2148 cells/uL (ref 850–3900)
MCH: 28 pg (ref 27.0–33.0)
MCHC: 32.7 g/dL (ref 32.0–36.0)
MCV: 85.8 fL (ref 80.0–100.0)
MPV: 12.3 fL (ref 7.5–12.5)
Monocytes Relative: 8.6 %
Neutro Abs: 3452 cells/uL (ref 1500–7800)
Neutrophils Relative %: 54.8 %
Platelets: 287 10*3/uL (ref 140–400)
RBC: 4.57 10*6/uL (ref 3.80–5.10)
RDW: 13.6 % (ref 11.0–15.0)
Total Lymphocyte: 34.1 %
WBC: 6.3 10*3/uL (ref 3.8–10.8)

## 2021-02-11 LAB — POCT URINE PREGNANCY: Preg Test, Ur: NEGATIVE

## 2021-02-11 NOTE — Patient Instructions (Signed)
You are constipated and need help to clean out the large amount of stool (poop) in the intestine. This guide tells you what medicine to use.  What do I need to know before starting the clean out?  It will take about 4 to 6 hours to take the medicine.  After taking the medicine, you should have a large stool within 24 hours.  Plan to stay close to a bathroom until the stool has passed. After the intestine is cleaned out, you will need to take a daily medicine.   Remember:  Constipation can last a long time. It may take 6 to 12 months for you to get back to regular bowel movements (BMs). Be patient. Things will get better slowly over time.  If you have questions, call your doctor at this number:     [phone]   When should you start the clean out?  Start the home clean out on a Friday afternoon or some other time when you will be home (and not at school).  Start between 2:00 and 4:00 in the afternoon.  You should have almost clear liquid stools by the end of the next day. If the medicine does not work or you don't know if it worked, Pharmacist, hospital or nurse.  What medicine do I need to take?  You need to take Miralax, a powder that you mix in a clear liquid.  Follow these steps: ?    Stir the Miralax powder into water, juice, or Gatorade. Your Miralax dose is: 8 capfuls of Miralax powder in 32 ounces of liquid ?    Drink 4 to 8 ounces every 30 minutes. It will take 4 to 6 hours to finish the medicine. ?    After the medicine is gone, drink more water or juice. This will help with the cleanout.   -     If the medicine gives you an upset stomach, slow down or stop. You will experience gas, bloating and discomfort before your bowels start to move.    Do I need to keep taking medicine?                                                                                                      After the clean out, you will take a daily (maintenance) medicine for at least 6  months. Your Miralax dose is:      1-2 capful of powder in 8 ounces of liquid every day   You should go to the doctor for follow-up appointments as directed.  What if I get constipated again?  Some people need to have the clean out more than one time for the problem to go away. Contact your doctor to ask if you should repeat the clean out. It is OK to do it again, but you should wait at least a week before repeating the clean out.    Will I have any problems with the medicine?   You may have stomach pain or cramping during the clean out. This might mean you have  to go to the bathroom.   Take some time to sit on the toilet. The pain will go away when the stool is gone. You may want to read while you wait. A warm bath may also help.   What should I eat and drink?  Drink lots of water and juice. Fruits and vegetables are good foods to eat. Try to avoid greasy and fatty foods.

## 2021-02-11 NOTE — Progress Notes (Signed)
History was provided by the patient.  Regina Wilkerson is a 25 y.o. female who is here for anorexia, binge eating/purging type, constipation.   PCP confirmed? No  HPI:   -urinary symptoms seem to be getting better with boric acid and probiotics -last purge: a few days ago  -24 hr food recall: [place] breast, clementine, pretzel, truly 1/2 can -water: not enough, usually 80 oz water (but lately not that)  -constipation: yes, last poop was a week ago; has miralax bottle at home; does not use it -using condoms   Patient Active Problem List   Diagnosis Date Noted   Elevated blood-pressure reading without diagnosis of hypertension 04/08/2020   Gastroesophageal reflux disease 01/07/2020   Anorexia nervosa, binge eating/purging type 10/15/2019   Slow transit constipation 10/15/2019   Adjustment disorder with mixed anxiety and depressed mood 10/15/2019   Sleep disturbance 10/15/2019   Chronic nonintractable headache 10/15/2019   Attention deficit hyperactivity disorder (ADHD), combined type 10/15/2019    Current Outpatient Medications on File Prior to Visit  Medication Sig Dispense Refill   amphetamine-dextroamphetamine (ADDERALL) 15 MG tablet Take 1 tablet by mouth daily. 30 tablet 0   busPIRone (BUSPAR) 5 MG tablet Take 1 tablet (5 mg total) by mouth 2 (two) times daily. 60 tablet 0   hydrOXYzine (ATARAX/VISTARIL) 25 MG tablet Take 1 tablet (25 mg total) by mouth 3 (three) times daily as needed. 30 tablet 1   levonorgestrel-ethinyl estradiol (AVIANE) 0.1-20 MG-MCG tablet Take 1 tablet by mouth daily. 84 tablet 3   lisdexamfetamine (VYVANSE) 60 MG capsule Take 1 capsule (60 mg total) by mouth every morning. 30 capsule 0   pantoprazole (PROTONIX) 40 MG tablet Take 1 tablet (40 mg total) by mouth daily. 30 tablet 3   propranolol (INDERAL) 20 MG tablet Take 20 mg by mouth as needed.     tacrolimus (PROTOPIC) 0.1 % ointment Apply topically 2 (two) times daily. 100 g 0   desvenlafaxine (PRISTIQ)  50 MG 24 hr tablet Take 1 tablet (50 mg total) by mouth daily. (Patient not taking: No sig reported) 30 tablet 3   Ferrous Fumarate (HEMOCYTE - 106 MG FE) 324 (106 Fe) MG TABS tablet Take 1 tablet (106 mg of iron total) by mouth daily. (Patient not taking: No sig reported) 30 tablet 3   fluconazole (DIFLUCAN) 150 MG tablet Take 1 tablet today and 1 tablet 3 days from now (Patient not taking: Reported on 02/11/2021) 2 tablet 0   No current facility-administered medications on file prior to visit.    Allergies  Allergen Reactions   Mango Flavor    Red Dye    Shellfish Allergy     Physical Exam:    Vitals:   02/11/21 0843  BP: 110/71  Pulse: 61  Weight: 175 lb 12.8 oz (79.7 kg)  Height: '5\' 3"'$  (1.6 m)   Wt Readings from Last 3 Encounters:  02/11/21 175 lb 12.8 oz (79.7 kg)  11/02/20 175 lb 6.4 oz (79.6 kg)  10/30/20 171 lb 12.8 oz (77.9 kg)     Growth percentile SmartLinks can only be used for patients less than 17 years old. No LMP recorded.  Physical Exam Vitals reviewed.  Constitutional:      General: She is not in acute distress. HENT:     Head: Normocephalic.     Mouth/Throat:     Pharynx: Oropharynx is clear.  Eyes:     General: No scleral icterus.    Extraocular Movements: Extraocular movements intact.  Pupils: Pupils are equal, round, and reactive to light.  Cardiovascular:     Rate and Rhythm: Normal rate and regular rhythm.     Heart sounds: No murmur heard. Pulmonary:     Effort: Pulmonary effort is normal.  Abdominal:     General: Bowel sounds are decreased.     Palpations: Abdomen is soft.     Tenderness: There is abdominal tenderness in the right lower quadrant and left lower quadrant.  Musculoskeletal:        General: No swelling. Normal range of motion.  Skin:    General: Skin is warm and dry.     Capillary Refill: Capillary refill takes less than 2 seconds.     Findings: No rash.  Neurological:     General: No focal deficit present.      Mental Status: She is oriented to person, place, and time.  Psychiatric:        Mood and Affect: Mood normal.    PHQ-SADS Last 3 Score only 02/11/2021 11/02/2020 10/11/2020  PHQ-15 Score '13 15 13  '$ Total GAD-7 Score '9 19 18  '$ PHQ-9 Total Score '8 15 12     '$ Assessment/Plan: 1. Anorexia nervosa, binge eating/purging type 2. Slow transit constipation -labs today due to recent purging; vitals stable  -chronic constipation likely contributing to recent purging  -clean out reviewed  -she will reach out by My Chart after clean out - one week video visit  - Amylase - CBC With Differential - Comprehensive metabolic panel - Ferritin - Lipase - Magnesium - Phosphorus - Sedimentation rate - Thyroid Panel With TSH - VITAMIN D 25 Hydroxy (Vit-D Deficiency, Fractures) - B12 and Folate Panel  3. Routine screening for STI (sexually transmitted infection) - Urine cytology ancillary only  4. Negative pregnancy test - POCT urine pregnancy

## 2021-02-11 NOTE — Addendum Note (Signed)
Addended by: Rejeana Brock on: 02/11/2021 11:05 AM   Modules accepted: Orders

## 2021-02-12 LAB — B12 AND FOLATE PANEL
Folate: 11.5 ng/mL
Vitamin B-12: 484 pg/mL (ref 200–1100)

## 2021-02-14 LAB — URINE CYTOLOGY ANCILLARY ONLY
Chlamydia: NEGATIVE
Comment: NEGATIVE
Comment: NEGATIVE
Comment: NORMAL
Neisseria Gonorrhea: NEGATIVE
Trichomonas: NEGATIVE

## 2021-02-16 ENCOUNTER — Telehealth (INDEPENDENT_AMBULATORY_CARE_PROVIDER_SITE_OTHER): Payer: Medicaid Other | Admitting: Family

## 2021-02-16 DIAGNOSIS — F5002 Anorexia nervosa, binge eating/purging type: Secondary | ICD-10-CM

## 2021-02-16 DIAGNOSIS — F4323 Adjustment disorder with mixed anxiety and depressed mood: Secondary | ICD-10-CM

## 2021-02-16 LAB — COMPREHENSIVE METABOLIC PANEL
AG Ratio: 1.8 (calc) (ref 1.0–2.5)
ALT: 8 U/L (ref 6–29)
AST: 14 U/L (ref 10–30)
Albumin: 4.5 g/dL (ref 3.6–5.1)
Alkaline phosphatase (APISO): 29 U/L — ABNORMAL LOW (ref 31–125)
BUN: 10 mg/dL (ref 7–25)
CO2: 28 mmol/L (ref 20–32)
Calcium: 9.3 mg/dL (ref 8.6–10.2)
Chloride: 103 mmol/L (ref 98–110)
Creat: 0.67 mg/dL (ref 0.50–0.96)
Globulin: 2.5 g/dL (calc) (ref 1.9–3.7)
Glucose, Bld: 78 mg/dL (ref 65–99)
Potassium: 3.8 mmol/L (ref 3.5–5.3)
Sodium: 139 mmol/L (ref 135–146)
Total Bilirubin: 0.5 mg/dL (ref 0.2–1.2)
Total Protein: 7 g/dL (ref 6.1–8.1)

## 2021-02-16 LAB — THYROID PANEL WITH TSH
Free Thyroxine Index: 2.6 (ref 1.4–3.8)
T3 Uptake: 29 % (ref 22–35)
T4, Total: 8.9 ug/dL (ref 5.1–11.9)
TSH: 1.25 mIU/L

## 2021-02-16 LAB — VITAMIN D 25 HYDROXY (VIT D DEFICIENCY, FRACTURES): Vit D, 25-Hydroxy: 23 ng/mL — ABNORMAL LOW (ref 30–100)

## 2021-02-16 LAB — AMYLASE: Amylase: 23 U/L (ref 21–101)

## 2021-02-16 LAB — SEDIMENTATION RATE

## 2021-02-16 LAB — FERRITIN: Ferritin: 11 ng/mL — ABNORMAL LOW (ref 16–154)

## 2021-02-16 LAB — PHOSPHORUS: Phosphorus: 3.8 mg/dL (ref 2.5–4.5)

## 2021-02-16 LAB — SED RATE MANUAL WEST RFLX: SED RATE BY MODIFIED WESTERGREN,MANUAL: 5 mm/h (ref 0–20)

## 2021-02-16 LAB — MAGNESIUM: Magnesium: 2.2 mg/dL (ref 1.5–2.5)

## 2021-02-16 LAB — LIPASE: Lipase: 16 U/L (ref 7–60)

## 2021-02-16 NOTE — Progress Notes (Signed)
THIS RECORD MAY CONTAIN CONFIDENTIAL INFORMATION THAT SHOULD NOT BE RELEASED WITHOUT REVIEW OF THE SERVICE PROVIDER.  Virtual Follow-Up Visit via Video Note  I connected with Regina Wilkerson on 02/16/21 at  4:30 PM EDT by a video enabled telemedicine application and verified that I am speaking with the correct person using two identifiers.   Patient/parent location: home   I discussed the limitations of evaluation and management by telemedicine and the availability of in person appointments.  I discussed that the purpose of this telehealth visit is to provide medical care while limiting exposure to the novel coronavirus.  The patient expressed understanding and agreed to proceed.   Regina Wilkerson is a 25 y.o. female referred by No ref. provider found here today for follow-up of Anorexia nervosa, binge-eating/purging type; adjustment disorder with mixed anxiety and depressed mood.   History was provided by the patient.  Supervising Physician: Dr. Lenore Wilkerson  Plan from Last Visit:   8/12: labs and constipation clean out   Chief Complaint: -adjustment disorder with mixed anxiety and depressed mood   History of Present Illness:  -was on Pristiq until April , trying Buspar daily for last 1-2 weeks -yesterday was the first day of school/college; new roommates, living situation  -has been a very stressful week; sometimes anxiety gets bad all at once  -seems like situational anxiety;   -seeing her therapist once weekly  -no SI/HI  -24 hr food recall  -mashed potatoes, chicken and veggies -water (40 oz Hydroflask) - usually fills up 2-3 times daily; yesterday not as much  -noticed heart palpitations yesterday more than normal, which she attributes to anxiety with situational stress of first day, etc    Allergies  Allergen Reactions   Mango Flavor    Red Dye    Shellfish Allergy    Outpatient Medications Prior to Visit  Medication Sig Dispense Refill   amphetamine-dextroamphetamine  (ADDERALL) 15 MG tablet Take 1 tablet by mouth daily. 30 tablet 0   busPIRone (BUSPAR) 5 MG tablet Take 1 tablet (5 mg total) by mouth 2 (two) times daily. 60 tablet 0   desvenlafaxine (PRISTIQ) 50 MG 24 hr tablet Take 1 tablet (50 mg total) by mouth daily. (Patient not taking: No sig reported) 30 tablet 3   Ferrous Fumarate (HEMOCYTE - 106 MG FE) 324 (106 Fe) MG TABS tablet Take 1 tablet (106 mg of iron total) by mouth daily. (Patient not taking: No sig reported) 30 tablet 3   fluconazole (DIFLUCAN) 150 MG tablet Take 1 tablet today and 1 tablet 3 days from now (Patient not taking: Reported on 02/11/2021) 2 tablet 0   hydrOXYzine (ATARAX/VISTARIL) 25 MG tablet Take 1 tablet (25 mg total) by mouth 3 (three) times daily as needed. 30 tablet 1   levonorgestrel-ethinyl estradiol (AVIANE) 0.1-20 MG-MCG tablet Take 1 tablet by mouth daily. 84 tablet 3   lisdexamfetamine (VYVANSE) 60 MG capsule Take 1 capsule (60 mg total) by mouth every morning. 30 capsule 0   pantoprazole (PROTONIX) 40 MG tablet Take 1 tablet (40 mg total) by mouth daily. 30 tablet 3   propranolol (INDERAL) 20 MG tablet Take 20 mg by mouth as needed.     tacrolimus (PROTOPIC) 0.1 % ointment Apply topically 2 (two) times daily. 100 g 0   No facility-administered medications prior to visit.     Patient Active Problem List   Diagnosis Date Noted   Elevated blood-pressure reading without diagnosis of hypertension 04/08/2020   Gastroesophageal reflux disease 01/07/2020  Anorexia nervosa, binge eating/purging type 10/15/2019   Slow transit constipation 10/15/2019   Adjustment disorder with mixed anxiety and depressed mood 10/15/2019   Sleep disturbance 10/15/2019   Chronic nonintractable headache 10/15/2019   Attention deficit hyperactivity disorder (ADHD), combined type 10/15/2019   The following portions of the patient's history were reviewed and updated as appropriate: allergies, current medications, past family history, past  medical history, past social history, past surgical history, and problem list.  Visual Observations/Objective:   General Appearance: Well nourished well developed, in no apparent distress.  Eyes: conjunctiva no swelling or erythema ENT/Mouth: No hoarseness, No cough for duration of visit.  Neck: Supple  Respiratory: Respiratory effort normal, normal rate, no retractions or distress.   Cardio: Appears well-perfused, noncyanotic Musculoskeletal: no obvious deformity Skin: visible skin without rashes, ecchymosis, erythema Neuro: Awake and oriented X 3,  Psych:  normal affect, Insight and Judgment appropriate.    Assessment/Plan: 1. Anorexia nervosa, binge eating/purging type 2. Adjustment disorder with mixed anxiety and depressed mood  -reviewed labs; continue with iron supplement 325 mg every other day with OJ to help with absorption for low ferritin; high dose vitamin D for 8 weeks for vitamin D deficiency; BMP, mag, phosphorus WNL. No meds changes at this time; continue with Buspar 5 mg BID; advised that we can increase if needed. She will check in by My Chart within the week; otherwise next follow up in 3-4 weeks.    [place] screenings:  PHQ-SADS Last 3 Score only 02/11/2021 11/02/2020 10/11/2020  PHQ-15 Score '13 15 13  '$ Total GAD-7 Score '9 19 18  '$ PHQ-9 Total Score '8 15 12   '$ Screens discussed with patient and parent and adjustments to plan made accordingly.   I discussed the assessment and treatment plan with the patient and/or parent/guardian.  They were provided an opportunity to ask questions and all were answered.  They agreed with the plan and demonstrated an understanding of the instructions. They were advised to call back or seek an in-person evaluation in the emergency room if the symptoms worsen or if the condition fails to improve as anticipated.   Follow-up:   3-4 weeks   Medical decision-making:   I spent 30 minutes on this telehealth visit inclusive of face-to-face video  and care coordination time I was located remote in [place] during this encounter.   Regina Ames, NP    CC: Pcp, No, No ref. provider found

## 2021-02-17 ENCOUNTER — Encounter: Payer: Self-pay | Admitting: Family

## 2021-02-17 ENCOUNTER — Other Ambulatory Visit: Payer: Self-pay | Admitting: Family

## 2021-02-17 DIAGNOSIS — F902 Attention-deficit hyperactivity disorder, combined type: Secondary | ICD-10-CM

## 2021-02-17 MED ORDER — AMPHETAMINE-DEXTROAMPHETAMINE 15 MG PO TABS: 15.0000 mg | ORAL_TABLET | Freq: Every day | ORAL | 0 refills | Status: DC

## 2021-02-17 MED ORDER — VITAMIN D (ERGOCALCIFEROL) 1.25 MG (50000 UNIT) PO CAPS: 50000.0000 [IU] | ORAL_CAPSULE | ORAL | 0 refills | Status: DC

## 2021-02-17 MED ORDER — LISDEXAMFETAMINE DIMESYLATE 60 MG PO CAPS: 60.0000 mg | ORAL_CAPSULE | ORAL | 0 refills | Status: DC

## 2021-03-19 ENCOUNTER — Other Ambulatory Visit: Payer: Self-pay | Admitting: Family

## 2021-03-19 DIAGNOSIS — F902 Attention-deficit hyperactivity disorder, combined type: Secondary | ICD-10-CM

## 2021-03-21 MED ORDER — AMPHETAMINE-DEXTROAMPHETAMINE 15 MG PO TABS: 15.0000 mg | ORAL_TABLET | Freq: Every day | ORAL | 0 refills | Status: DC

## 2021-03-21 MED ORDER — LISDEXAMFETAMINE DIMESYLATE 60 MG PO CAPS: 60.0000 mg | ORAL_CAPSULE | ORAL | 0 refills | Status: DC

## 2021-03-21 MED ORDER — BUSPIRONE HCL 5 MG PO TABS: 5.0000 mg | ORAL_TABLET | Freq: Two times a day (BID) | ORAL | 0 refills | Status: DC

## 2021-03-23 ENCOUNTER — Other Ambulatory Visit: Payer: Self-pay

## 2021-03-23 ENCOUNTER — Encounter: Payer: Self-pay | Admitting: Family

## 2021-03-23 ENCOUNTER — Telehealth (INDEPENDENT_AMBULATORY_CARE_PROVIDER_SITE_OTHER): Payer: Medicaid Other | Admitting: Family

## 2021-03-23 DIAGNOSIS — F50029 Anorexia nervosa, binge eating/purging type, unspecified: Secondary | ICD-10-CM

## 2021-03-23 DIAGNOSIS — F5002 Anorexia nervosa, binge eating/purging type: Secondary | ICD-10-CM | POA: Diagnosis not present

## 2021-03-23 DIAGNOSIS — F4323 Adjustment disorder with mixed anxiety and depressed mood: Secondary | ICD-10-CM

## 2021-03-23 DIAGNOSIS — F902 Attention-deficit hyperactivity disorder, combined type: Secondary | ICD-10-CM | POA: Diagnosis not present

## 2021-03-23 NOTE — Progress Notes (Signed)
THIS RECORD MAY CONTAIN CONFIDENTIAL INFORMATION THAT SHOULD NOT BE RELEASED WITHOUT REVIEW OF THE SERVICE PROVIDER.  Virtual Follow-Up Visit via Video Note  I connected with Regina Wilkerson  on 03/23/21 at  2:00 PM EDT by a video enabled telemedicine application and verified that I am speaking with the correct person using two identifiers.   Patient/parent location: apartment, [place]    I discussed the limitations of evaluation and management by telemedicine and the availability of in person appointments.  I discussed that the purpose of this telehealth visit is to provide medical care while limiting exposure to the novel coronavirus.  The patient expressed understanding and agreed to proceed.   Regina Wilkerson is a 25 y.o. female referred by No ref. provider found here today for follow-up of  Anorexia nervosa, binge-purging type, adjustment disorder with mixed anxiety and depressed mood, and ADHD, combined type.    History was provided by the patient.  Supervising Physician: Dr. Lenore Cordia  Plan from Last Visit:   -buspar 5 mg BID -hydroxyzine 25 mg TID or 50 mg for sleep  -Vyvanse 60 mg  -Adderall 15 mg   Chief Complaint: Anorexia nervosa, binge-purge type   History of Present Illness:  -easier to not eat but when she does she is purging -yesterday was last purge; usually tries to get over the urge; will do it an hour or two after eating provoked by drinking a lot of water to throw up   -therapy this morning; therapist wants her to send a picture of snack 3 times per day; next therapy session is Wednesday  -past 2 weeks so busy and by the time she feels hungry it is too late to eat -therapy every Wednesday  -no SI/HI  -she does feel the buspar 5 mg BID is helping.    Allergies  Allergen Reactions   Mango Flavor    Red Dye    Shellfish Allergy    Outpatient Medications Prior to Visit  Medication Sig Dispense Refill   amphetamine-dextroamphetamine (ADDERALL) 15 MG tablet  Take 1 tablet by mouth daily. 30 tablet 0   busPIRone (BUSPAR) 5 MG tablet Take 1 tablet (5 mg total) by mouth 2 (two) times daily. 60 tablet 0   hydrOXYzine (ATARAX/VISTARIL) 25 MG tablet Take 1 tablet (25 mg total) by mouth 3 (three) times daily as needed. 30 tablet 1   levonorgestrel-ethinyl estradiol (AVIANE) 0.1-20 MG-MCG tablet Take 1 tablet by mouth daily. 84 tablet 3   lisdexamfetamine (VYVANSE) 60 MG capsule Take 1 capsule (60 mg total) by mouth every morning. 30 capsule 0   pantoprazole (PROTONIX) 40 MG tablet Take 1 tablet (40 mg total) by mouth daily. 30 tablet 3   propranolol (INDERAL) 20 MG tablet Take 20 mg by mouth as needed.     tacrolimus (PROTOPIC) 0.1 % ointment Apply topically 2 (two) times daily. 100 g 0   Vitamin D, Ergocalciferol, (DRISDOL) 1.25 MG (50000 UNIT) CAPS capsule Take 1 capsule (50,000 Units total) by mouth every 7 (seven) days. 8 capsule 0   No facility-administered medications prior to visit.     Patient Active Problem List   Diagnosis Date Noted   Elevated blood-pressure reading without diagnosis of hypertension 04/08/2020   Gastroesophageal reflux disease 01/07/2020   Anorexia nervosa, binge eating/purging type 10/15/2019   Slow transit constipation 10/15/2019   Adjustment disorder with mixed anxiety and depressed mood 10/15/2019   Sleep disturbance 10/15/2019   Chronic nonintractable headache 10/15/2019   Attention deficit hyperactivity disorder (  ADHD), combined type 10/15/2019   The following portions of the patient's history were reviewed and updated as appropriate: allergies, current medications, past family history, past medical history, past social history, past surgical history, and problem list.  Visual Observations/Objective:  General Appearance: Well nourished well developed, in no apparent distress.  Eyes: conjunctiva no swelling or erythema ENT/Mouth: No hoarseness, No cough for duration of visit.  Neck: Supple  Respiratory:  Respiratory effort normal, normal rate, no retractions or distress.   Cardio: Appears well-perfused, noncyanotic Musculoskeletal: no obvious deformity Skin: visible skin without rashes, ecchymosis, erythema Neuro: Awake and oriented X 3,  Psych:  normal affect, Insight and Judgment appropriate.    Assessment/Plan: 1. Anorexia nervosa, binge eating/purging type 2. Adjustment disorder with mixed anxiety and depressed mood 3. Attention deficit hyperactivity disorder (ADHD), combined type  -continue buspar 5 mg BID; consider increase next week - can go up to 10 mg BID  -monitoring labs and vitals next week if still purging  -talked through distracting techniques; reviewed rule of 3s; she is open to trying that and planning her food consumption for the day with her schedule   BH screenings:  PHQ-SADS Last 3 Score only 02/11/2021 11/02/2020 10/11/2020  PHQ-15 Score 13 15 13   Total GAD-7 Score 9 19 18   PHQ Adolescent Score 8 16 12     Screens discussed with patient and parent and adjustments to plan made accordingly.   I discussed the assessment and treatment plan with the patient and/or parent/guardian.  They were provided an opportunity to ask questions and all were answered.  They agreed with the plan and demonstrated an understanding of the instructions. They were advised to call back or seek an in-person evaluation in the emergency room if the symptoms worsen or if the condition fails to improve as anticipated.   Follow-up:   one week video   Medical decision-making:   I spent 30 minutes on this telehealth visit inclusive of face-to-face video and care coordination time I was located remote in [place] during this encounter.   Parthenia Ames, NP    CC: Pcp, No, No ref. provider found

## 2021-03-30 ENCOUNTER — Telehealth (INDEPENDENT_AMBULATORY_CARE_PROVIDER_SITE_OTHER): Payer: Medicaid Other | Admitting: Family

## 2021-03-30 ENCOUNTER — Encounter: Payer: Self-pay | Admitting: Family

## 2021-03-30 DIAGNOSIS — F902 Attention-deficit hyperactivity disorder, combined type: Secondary | ICD-10-CM

## 2021-03-30 DIAGNOSIS — F4323 Adjustment disorder with mixed anxiety and depressed mood: Secondary | ICD-10-CM

## 2021-03-30 DIAGNOSIS — F5002 Anorexia nervosa, binge eating/purging type: Secondary | ICD-10-CM

## 2021-03-30 NOTE — Progress Notes (Signed)
THIS RECORD MAY CONTAIN CONFIDENTIAL INFORMATION THAT SHOULD NOT BE RELEASED WITHOUT REVIEW OF THE SERVICE PROVIDER.  Virtual Follow-Up Visit via Video Note  I connected with Regina Wilkerson  on 03/30/21 at  2:30 PM EDT by a video enabled telemedicine application and verified that I am speaking with the correct person using two identifiers.   Patient/parent location: apartment in [place]   I discussed the limitations of evaluation and management by telemedicine and the availability of in person appointments.  I discussed that the purpose of this telehealth visit is to provide medical care while limiting exposure to the novel coronavirus.  The patient expressed understanding and agreed to proceed.   Regina Wilkerson is a 25 y.o. female referred by No ref. provider found here today for follow-up of Anorexia nervosa, binge-eating/purging type.   History was provided by the patient.  Supervising Physician: Dr. Lenore Wilkerson  Plan from Last Visit:   Buspar 5 mg BID Vyvanse  60 mg, Adderall 15 mg in PM    Chief Complaint: purging   History of Present Illness:  -purged twice in a week  -buspar missed a dose and could tell so she feels it is working; not consistently taking it  -pertinent positives: constipation, dizziness, chest pain with anxiety, SOB, headaches when she doesn't eat  -24 hr food recall: chicken/rice/veggie soup, applesauce, gummy vitamins; about 20 ounces of water -had therapy session earlier today: endorses being in a weird place right now - she is able to recognize the cycle of having some good days of eating then having bad days where she doesn't eat  -no SI/HI    Allergies  Allergen Reactions   Mango Flavor    Red Dye    Shellfish Allergy    Outpatient Medications Prior to Visit  Medication Sig Dispense Refill   amphetamine-dextroamphetamine (ADDERALL) 15 MG tablet Take 1 tablet by mouth daily. 30 tablet 0   busPIRone (BUSPAR) 5 MG tablet Take 1 tablet (5 mg total)  by mouth 2 (two) times daily. 60 tablet 0   hydrOXYzine (ATARAX/VISTARIL) 25 MG tablet Take 1 tablet (25 mg total) by mouth 3 (three) times daily as needed. 30 tablet 1   levonorgestrel-ethinyl estradiol (AVIANE) 0.1-20 MG-MCG tablet Take 1 tablet by mouth daily. 84 tablet 3   lisdexamfetamine (VYVANSE) 60 MG capsule Take 1 capsule (60 mg total) by mouth every morning. 30 capsule 0   pantoprazole (PROTONIX) 40 MG tablet Take 1 tablet (40 mg total) by mouth daily. 30 tablet 3   propranolol (INDERAL) 20 MG tablet Take 20 mg by mouth as needed.     tacrolimus (PROTOPIC) 0.1 % ointment Apply topically 2 (two) times daily. 100 g 0   Vitamin D, Ergocalciferol, (DRISDOL) 1.25 MG (50000 UNIT) CAPS capsule Take 1 capsule (50,000 Units total) by mouth every 7 (seven) days. 8 capsule 0   No facility-administered medications prior to visit.     Patient Active Problem List   Diagnosis Date Noted   Elevated blood-pressure reading without diagnosis of hypertension 04/08/2020   Gastroesophageal reflux disease 01/07/2020   Anorexia nervosa, binge eating/purging type 10/15/2019   Slow transit constipation 10/15/2019   Adjustment disorder with mixed anxiety and depressed mood 10/15/2019   Sleep disturbance 10/15/2019   Chronic nonintractable headache 10/15/2019   Attention deficit hyperactivity disorder (ADHD), combined type 10/15/2019    The following portions of the patient's history were reviewed and updated as appropriate: allergies, current medications, past family history, past medical history, past social history,  past surgical history, and problem list.  Visual Observations/Objective:  General Appearance: Well nourished well developed, in no apparent distress.  Eyes: conjunctiva no swelling or erythema ENT/Mouth: No hoarseness, No cough for duration of visit.  Neck: Supple  Respiratory: Respiratory effort normal, normal rate, no retractions or distress.   Cardio: Appears well-perfused,  noncyanotic Musculoskeletal: no obvious deformity Skin: visible skin without rashes, ecchymosis, erythema Neuro: Awake and oriented X 3,  Psych:  normal affect, Insight and Judgment appropriate.    Assessment/Plan:  1. Anorexia nervosa, binge eating/purging type 2. Adjustment disorder with mixed anxiety and depressed mood 3. Attention deficit hyperactivity disorder (ADHD), combined type  -needs labs (mag, phos, BMP, amylase, lipase); repeat ferritin (was 11 on 8/12)  -discussed that she may benefit from change/addition in medication; I would consider olanzapine 5 mg at night or SNRI; she has low motivation for change or more engagement in treatment at this time. Consider MDQ at next check-in. Return in person within a week.    [place] screenings:  PHQ-SADS Last 3 Score only 02/11/2021 11/02/2020 10/11/2020  PHQ-15 Score 13 15 13   Total GAD-7 Score 9 19 18   PHQ Adolescent Score 8 16 12     Screens discussed with patient and parent and adjustments to plan made accordingly.   I discussed the assessment and treatment plan with the patient and/or parent/guardian.  They were provided an opportunity to ask questions and all were answered.  They agreed with the plan and demonstrated an understanding of the instructions. They were advised to call back or seek an in-person evaluation in the emergency room if the symptoms worsen or if the condition fails to improve as anticipated.   Follow-up:   within a week   Medical decision-making:   I spent 30 minutes on this telehealth visit inclusive of face-to-face video and care coordination time I was located remote in [place] during this encounter.   Regina Ames, NP    CC: Pcp, No, No ref. provider found

## 2021-04-05 ENCOUNTER — Ambulatory Visit: Payer: Medicaid Other | Admitting: Family

## 2021-04-07 ENCOUNTER — Encounter: Payer: Self-pay | Admitting: Family

## 2021-04-07 ENCOUNTER — Ambulatory Visit (INDEPENDENT_AMBULATORY_CARE_PROVIDER_SITE_OTHER): Payer: Medicaid Other | Admitting: Family

## 2021-04-07 ENCOUNTER — Other Ambulatory Visit: Payer: Self-pay

## 2021-04-07 VITALS — BP 108/69 | HR 75 | Ht 63.0 in | Wt 167.0 lb

## 2021-04-07 DIAGNOSIS — R300 Dysuria: Secondary | ICD-10-CM | POA: Diagnosis not present

## 2021-04-07 DIAGNOSIS — Z3202 Encounter for pregnancy test, result negative: Secondary | ICD-10-CM

## 2021-04-07 DIAGNOSIS — F5002 Anorexia nervosa, binge eating/purging type: Secondary | ICD-10-CM

## 2021-04-07 LAB — POCT URINALYSIS DIPSTICK
Blood, UA: NEGATIVE
Glucose, UA: NEGATIVE
Ketones, UA: NEGATIVE
Nitrite, UA: NEGATIVE
Protein, UA: NEGATIVE
Spec Grav, UA: 1.025 (ref 1.010–1.025)
Urobilinogen, UA: 1 E.U./dL
pH, UA: 6.5 (ref 5.0–8.0)

## 2021-04-07 LAB — POCT URINE PREGNANCY: Preg Test, Ur: NEGATIVE

## 2021-04-07 NOTE — Progress Notes (Signed)
History was provided by the patient.  Regina Wilkerson is a 25 y.o. female who is here for Anorexia nervosa, binge eating/purging type.   HPI:   -last purge was yesterday  -things are about the same  -feels she may be getting a UTI, took Azos yesterday; urinary frequency, no blood seen in urine; some lower back pain L>R -no pain with intercourse  PHQ-SADS Last 3 Score only 04/07/2021 02/11/2021 11/02/2020  PHQ-15 Score 9 13 15   Total GAD-7 Score 3 9 19   PHQ Adolescent Score 3 8 16     Patient Active Problem List   Diagnosis Date Noted   Elevated blood-pressure reading without diagnosis of hypertension 04/08/2020   Gastroesophageal reflux disease 01/07/2020   Anorexia nervosa, binge eating/purging type 10/15/2019   Slow transit constipation 10/15/2019   Adjustment disorder with mixed anxiety and depressed mood 10/15/2019   Sleep disturbance 10/15/2019   Chronic nonintractable headache 10/15/2019   Attention deficit hyperactivity disorder (ADHD), combined type 10/15/2019    Current Outpatient Medications on File Prior to Visit  Medication Sig Dispense Refill   amphetamine-dextroamphetamine (ADDERALL) 15 MG tablet Take 1 tablet by mouth daily. 30 tablet 0   busPIRone (BUSPAR) 5 MG tablet Take 1 tablet (5 mg total) by mouth 2 (two) times daily. 60 tablet 0   hydrOXYzine (ATARAX/VISTARIL) 25 MG tablet Take 1 tablet (25 mg total) by mouth 3 (three) times daily as needed. 30 tablet 1   levonorgestrel-ethinyl estradiol (AVIANE) 0.1-20 MG-MCG tablet Take 1 tablet by mouth daily. 84 tablet 3   lisdexamfetamine (VYVANSE) 60 MG capsule Take 1 capsule (60 mg total) by mouth every morning. 30 capsule 0   pantoprazole (PROTONIX) 40 MG tablet Take 1 tablet (40 mg total) by mouth daily. 30 tablet 3   propranolol (INDERAL) 20 MG tablet Take 20 mg by mouth as needed.     tacrolimus (PROTOPIC) 0.1 % ointment Apply topically 2 (two) times daily. 100 g 0   Vitamin D, Ergocalciferol, (DRISDOL) 1.25 MG (50000  UNIT) CAPS capsule Take 1 capsule (50,000 Units total) by mouth every 7 (seven) days. 8 capsule 0   No current facility-administered medications on file prior to visit.    Allergies  Allergen Reactions   Mango Flavor    Red Dye    Shellfish Allergy     Physical Exam:    Vitals:   04/07/21 1014  BP: 108/69  Pulse: 75  Weight: 167 lb (75.8 kg)  Height: 5\' 3"  (1.6 m)   Wt Readings from Last 3 Encounters:  04/07/21 167 lb (75.8 kg)  02/11/21 175 lb 12.8 oz (79.7 kg)  11/02/20 175 lb 6.4 oz (79.6 kg)     Growth percentile SmartLinks can only be used for patients less than 49 years old. No LMP recorded.  Physical Exam Vitals reviewed.  Constitutional:      Appearance: Normal appearance. She is not toxic-appearing.  HENT:     Head: Normocephalic.     Mouth/Throat:     Pharynx: Oropharynx is clear.  Eyes:     General: No scleral icterus.    Extraocular Movements: Extraocular movements intact.     Pupils: Pupils are equal, round, and reactive to light.  Cardiovascular:     Rate and Rhythm: Normal rate and regular rhythm.     Heart sounds: No murmur heard. Pulmonary:     Effort: Pulmonary effort is normal.  Abdominal:     Tenderness: There is no right CVA tenderness or left CVA tenderness.  Musculoskeletal:  General: No swelling. Normal range of motion.     Cervical back: Normal range of motion and neck supple.  Lymphadenopathy:     Cervical: No cervical adenopathy.  Skin:    General: Skin is warm and dry.     Capillary Refill: Capillary refill takes less than 2 seconds.     Findings: No rash.  Neurological:     General: No focal deficit present.     Mental Status: She is alert and oriented to person, place, and time.  Psychiatric:        Mood and Affect: Mood normal.     Assessment/Plan: 1. Dysuria -will screen for infection (urine dip negative), return precautions for kidney stones, pyelonephritis -negative for CVAT, bilateral lumbosacral tightness  only  - POCT urinalysis dipstick  2. Anorexia nervosa, binge eating/purging type -will screen for electrolyte abnormalities associated with purging  -no med changes today  -little motivation for change at presetn  - Magnesium - Phosphorus - BASIC METABOLIC PANEL WITH GFR  2. Negative pregnancy test - POCT urine pregnancy, negative today

## 2021-04-08 LAB — BASIC METABOLIC PANEL WITH GFR
BUN: 9 mg/dL (ref 7–25)
CO2: 27 mmol/L (ref 20–32)
Calcium: 9.9 mg/dL (ref 8.6–10.2)
Chloride: 101 mmol/L (ref 98–110)
Creat: 0.73 mg/dL (ref 0.50–0.96)
Glucose, Bld: 77 mg/dL (ref 65–99)
Potassium: 3.9 mmol/L (ref 3.5–5.3)
Sodium: 138 mmol/L (ref 135–146)
eGFR: 119 mL/min/{1.73_m2} (ref >=60)

## 2021-04-08 LAB — PHOSPHORUS: Phosphorus: 4.1 mg/dL (ref 2.5–4.5)

## 2021-04-08 LAB — MAGNESIUM: Magnesium: 1.9 mg/dL (ref 1.5–2.5)

## 2021-04-18 ENCOUNTER — Other Ambulatory Visit: Payer: Self-pay | Admitting: Family

## 2021-04-18 DIAGNOSIS — F902 Attention-deficit hyperactivity disorder, combined type: Secondary | ICD-10-CM

## 2021-04-18 MED ORDER — AMPHETAMINE-DEXTROAMPHETAMINE 15 MG PO TABS: 15.0000 mg | ORAL_TABLET | Freq: Every day | ORAL | 0 refills | Status: DC

## 2021-04-18 MED ORDER — LISDEXAMFETAMINE DIMESYLATE 60 MG PO CAPS: 60.0000 mg | ORAL_CAPSULE | ORAL | 0 refills | Status: DC

## 2021-04-18 MED ORDER — BUSPIRONE HCL 5 MG PO TABS: 5.0000 mg | ORAL_TABLET | Freq: Two times a day (BID) | ORAL | 0 refills | Status: DC

## 2021-04-20 ENCOUNTER — Encounter: Payer: Self-pay | Admitting: Family

## 2021-04-20 ENCOUNTER — Telehealth (INDEPENDENT_AMBULATORY_CARE_PROVIDER_SITE_OTHER): Payer: Medicaid Other | Admitting: Family

## 2021-04-20 DIAGNOSIS — F5002 Anorexia nervosa, binge eating/purging type: Secondary | ICD-10-CM | POA: Diagnosis not present

## 2021-04-20 DIAGNOSIS — R300 Dysuria: Secondary | ICD-10-CM

## 2021-04-20 DIAGNOSIS — F4323 Adjustment disorder with mixed anxiety and depressed mood: Secondary | ICD-10-CM | POA: Diagnosis not present

## 2021-04-20 NOTE — Progress Notes (Signed)
THIS RECORD MAY CONTAIN CONFIDENTIAL INFORMATION THAT SHOULD NOT BE RELEASED WITHOUT REVIEW OF THE SERVICE PROVIDER.  Virtual Follow-Up Visit via Video Note  I connected with Regina Wilkerson   on 04/20/21 at  3:00 PM EDT by a video enabled telemedicine application and verified that I am speaking with the correct person using two identifiers.    Patient/parent location: apartment in [place], [place]    I discussed the limitations of evaluation and management by telemedicine and the availability of in person appointments.  I discussed that the purpose of this telehealth visit is to provide medical care while limiting exposure to the novel coronavirus.  The patient expressed understanding and agreed to proceed.   Regina Wilkerson is a 25 y.o. female referred by No ref. provider found here today for follow-up of Anorexia nervosa, binge eating/purging type.Marland Kitchen   History was provided by the patient.  Supervising Physician: Dr. 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 - she is unsure what else she would rather do  -is very busy with theater productions, sorority and classes  -had to leave a class early Friday because of back pain  -has intermittent pain with urination  -took Azos (2 pills) and urine was weird color  -when she awoke it was very dark  -no frank red blood noted -denies fever, nausea/vomiting  -reviewed Urology referral; she had appt but missed it due to not being in town    Allergies  Allergen Reactions   Mango Flavor    Red Dye    Shellfish Allergy    Outpatient Medications Prior to Visit  Medication Sig Dispense Refill   amphetamine-dextroamphetamine  (ADDERALL) 15 MG tablet Take 1 tablet by mouth daily. 30 tablet 0   busPIRone (BUSPAR) 5 MG tablet Take 1 tablet (5 mg total) by mouth 2 (two) times daily. 60 tablet 0   hydrOXYzine (ATARAX/VISTARIL) 25 MG tablet Take 1 tablet (25 mg total) by mouth 3 (three) times daily as needed. 30 tablet 1   levonorgestrel-ethinyl estradiol (AVIANE) 0.1-20 MG-MCG tablet Take 1 tablet by mouth daily. 84 tablet 3   lisdexamfetamine (VYVANSE) 60 MG capsule Take 1 capsule (60 mg total) by mouth every morning. 30 capsule 0   pantoprazole (PROTONIX) 40 MG tablet Take 1 tablet (40 mg total) by mouth daily. 30 tablet 3   propranolol (INDERAL) 20 MG tablet Take 20 mg by mouth as needed.     tacrolimus (PROTOPIC) 0.1 % ointment Apply topically 2 (two) times daily. 100 g 0   Vitamin D, Ergocalciferol, (DRISDOL) 1.25 MG (50000 UNIT) CAPS capsule Take 1 capsule (50,000 Units total) by mouth every 7 (seven) days. 8 capsule 0   No facility-administered medications prior to visit.     Patient Active Problem List   Diagnosis Date Noted   Elevated blood-pressure reading without diagnosis of hypertension 04/08/2020   Gastroesophageal reflux disease 01/07/2020   Anorexia nervosa, binge eating/purging type 10/15/2019   Slow transit constipation 10/15/2019   Adjustment disorder with mixed anxiety and depressed mood 10/15/2019   Sleep disturbance 10/15/2019   Chronic nonintractable headache 10/15/2019   Attention deficit hyperactivity disorder (ADHD), combined type 10/15/2019  The following portions of the patient's history were reviewed and updated as appropriate: allergies, current medications, past family history, past medical history, past social history, past surgical history, and problem list.  Visual Observations/Objective:   General Appearance: Well nourished well developed, in no apparent distress.  Eyes: conjunctiva no swelling or erythema ENT/Mouth: No hoarseness, No cough for duration of visit.  Neck:  Supple  Respiratory: Respiratory effort normal, normal rate, no retractions or distress.   Cardio: Appears well-perfused, noncyanotic Musculoskeletal: no obvious deformity Skin: visible skin without rashes, ecchymosis, erythema Neuro: Awake and oriented X 3,  Psych:  normal affect, Insight and Judgment appropriate.    Assessment/Plan:  -ER/UC return precautions for kidney stones, pyelonephritis; gave her [facility] phone number  -nitrite negative on urine dip 10/06  -BMP, mag, phosphorus WNL 10/06 -follow up in one month or sooner as needed   1. Dysuria 2. Anorexia nervosa, binge eating/purging type 3. Adjustment disorder with mixed anxiety and depressed mood   BH screenings:  PHQ-SADS Last 3 Score only 04/07/2021 02/11/2021 11/02/2020  PHQ-15 Score 9 13 15   Total GAD-7 Score 3 9 19   PHQ Adolescent Score 3 8 16     Screens discussed with patient and parent and adjustments to plan made accordingly.   I discussed the assessment and treatment plan with the patient and/or parent/guardian.  They were provided an opportunity to ask questions and all were answered.  They agreed with the plan and demonstrated an understanding of the instructions. They were advised to call back or seek an in-person evaluation in the emergency room if the symptoms worsen or if the condition fails to improve as anticipated.   Follow-up:  one month   Medical decision-making:   I spent 30 minutes on this telehealth visit inclusive of face-to-face video and care coordination time I was located remote in [place] during this encounter.   Parthenia Ames, NP    CC: Pcp, No, No ref. provider found

## 2021-04-21 ENCOUNTER — Encounter: Payer: Self-pay | Admitting: Family

## 2021-05-18 ENCOUNTER — Telehealth: Payer: Medicaid Other | Admitting: Family

## 2021-05-18 ENCOUNTER — Encounter: Payer: Self-pay | Admitting: Family

## 2021-05-18 DIAGNOSIS — F5002 Anorexia nervosa, binge eating/purging type: Secondary | ICD-10-CM

## 2021-05-18 NOTE — Progress Notes (Signed)
Patient not seen. Closed for admin purposes.  

## 2021-05-25 ENCOUNTER — Other Ambulatory Visit: Payer: Self-pay | Admitting: Family

## 2021-05-30 MED ORDER — VITAMIN D (ERGOCALCIFEROL) 1.25 MG (50000 UNIT) PO CAPS: 50000.0000 [IU] | ORAL_CAPSULE | ORAL | 0 refills | Status: DC

## 2021-06-11 ENCOUNTER — Encounter: Payer: Self-pay | Admitting: Family

## 2021-06-12 ENCOUNTER — Other Ambulatory Visit: Payer: Self-pay | Admitting: Family

## 2021-06-12 DIAGNOSIS — F5002 Anorexia nervosa, binge eating/purging type: Secondary | ICD-10-CM

## 2021-06-12 DIAGNOSIS — F902 Attention-deficit hyperactivity disorder, combined type: Secondary | ICD-10-CM

## 2021-06-12 MED ORDER — BUSPIRONE HCL 5 MG PO TABS: 5.0000 mg | ORAL_TABLET | Freq: Two times a day (BID) | ORAL | 0 refills | Status: DC

## 2021-06-12 MED ORDER — LISDEXAMFETAMINE DIMESYLATE 60 MG PO CAPS: 60.0000 mg | ORAL_CAPSULE | ORAL | 0 refills | Status: DC

## 2021-06-12 MED ORDER — VITAMIN D (ERGOCALCIFEROL) 1.25 MG (50000 UNIT) PO CAPS: 50000.0000 [IU] | ORAL_CAPSULE | ORAL | 0 refills | Status: DC

## 2021-06-12 MED ORDER — AMPHETAMINE-DEXTROAMPHETAMINE 15 MG PO TABS: 15.0000 mg | ORAL_TABLET | Freq: Every day | ORAL | 0 refills | Status: DC

## 2021-06-15 ENCOUNTER — Other Ambulatory Visit: Payer: Self-pay

## 2021-06-15 ENCOUNTER — Telehealth (INDEPENDENT_AMBULATORY_CARE_PROVIDER_SITE_OTHER): Payer: BC Managed Care – PPO | Admitting: Family

## 2021-06-15 ENCOUNTER — Encounter: Payer: Self-pay | Admitting: Family

## 2021-06-15 DIAGNOSIS — F4323 Adjustment disorder with mixed anxiety and depressed mood: Secondary | ICD-10-CM

## 2021-06-15 DIAGNOSIS — F902 Attention-deficit hyperactivity disorder, combined type: Secondary | ICD-10-CM | POA: Diagnosis not present

## 2021-06-15 DIAGNOSIS — K219 Gastro-esophageal reflux disease without esophagitis: Secondary | ICD-10-CM | POA: Diagnosis not present

## 2021-06-15 DIAGNOSIS — F5002 Anorexia nervosa, binge eating/purging type: Secondary | ICD-10-CM

## 2021-06-15 MED ORDER — LEVONORGESTREL-ETHINYL ESTRAD 0.1-20 MG-MCG PO TABS: 1.0000 | ORAL_TABLET | Freq: Every day | ORAL | 3 refills | Status: DC

## 2021-06-15 MED ORDER — PANTOPRAZOLE SODIUM 40 MG PO TBEC: 40.0000 mg | DELAYED_RELEASE_TABLET | Freq: Every day | ORAL | 3 refills | Status: DC
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
## 2021-07-12 ENCOUNTER — Other Ambulatory Visit (HOSPITAL_COMMUNITY)
Admission: RE | Admit: 2021-07-12 | Discharge: 2021-07-12 | Disposition: A | Discharge status: Home / Self Care | Payer: BC Managed Care – PPO | Source: Ambulatory Visit | Attending: Pediatrics | Admitting: Pediatrics

## 2021-07-12 ENCOUNTER — Encounter: Payer: Self-pay | Admitting: Pediatrics

## 2021-07-12 ENCOUNTER — Ambulatory Visit (INDEPENDENT_AMBULATORY_CARE_PROVIDER_SITE_OTHER): Payer: BC Managed Care – PPO | Admitting: Pediatrics

## 2021-07-12 VITALS — BP 111/74 | HR 70 | Wt 171.2 lb

## 2021-07-12 DIAGNOSIS — G479 Sleep disorder, unspecified: Secondary | ICD-10-CM

## 2021-07-12 DIAGNOSIS — Z113 Encounter for screening for infections with a predominantly sexual mode of transmission: Secondary | ICD-10-CM | POA: Insufficient documentation

## 2021-07-12 DIAGNOSIS — F902 Attention-deficit hyperactivity disorder, combined type: Secondary | ICD-10-CM

## 2021-07-12 DIAGNOSIS — Z3202 Encounter for pregnancy test, result negative: Secondary | ICD-10-CM

## 2021-07-12 DIAGNOSIS — R222 Localized swelling, mass and lump, trunk: Secondary | ICD-10-CM | POA: Diagnosis not present

## 2021-07-12 DIAGNOSIS — F5002 Anorexia nervosa, binge eating/purging type: Secondary | ICD-10-CM | POA: Diagnosis not present

## 2021-07-12 DIAGNOSIS — F4323 Adjustment disorder with mixed anxiety and depressed mood: Secondary | ICD-10-CM

## 2021-07-12 DIAGNOSIS — D235 Other benign neoplasm of skin of trunk: Secondary | ICD-10-CM

## 2021-07-12 DIAGNOSIS — Z1389 Encounter for screening for other disorder: Secondary | ICD-10-CM

## 2021-07-12 LAB — POCT URINALYSIS DIPSTICK
Bilirubin, UA: NEGATIVE
Glucose, UA: NEGATIVE
Ketones, UA: NEGATIVE
Leukocytes, UA: NEGATIVE
Nitrite, UA: NEGATIVE
Protein, UA: NEGATIVE
Spec Grav, UA: 1.025 (ref 1.010–1.025)
Urobilinogen, UA: 0.2 E.U./dL — AB
pH, UA: 5 (ref 5.0–8.0)

## 2021-07-12 LAB — POCT URINE PREGNANCY: Preg Test, Ur: NEGATIVE

## 2021-07-12 MED ORDER — BUSPIRONE HCL 7.5 MG PO TABS: 7.5000 mg | ORAL_TABLET | Freq: Three times a day (TID) | ORAL | 3 refills | Status: DC

## 2021-07-12 NOTE — Patient Instructions (Signed)
Let us know if you miss your period or if your bleeding doesn't resolve or pain gets worse  We will send urine for infection  Buspar 7.5 mg three times daily, if you can't remember this, you can take 2 (15 mg) in the AM

## 2021-07-12 NOTE — Progress Notes (Signed)
History was provided by the patient.  Regina Wilkerson is a 26 y.o. female who is here for anxiety, depression, anorexia, ADHD.  Default, Provider, MD   HPI:  Pt reports taking lunesta 1 mg at bedtime which she feels like isn't working as well as she would like. Feels like her mood has been more up and down since coming back. The past two nights she has slept better, more than 8 hours. She has taken 3-4 benadryl with her lunesta. She has taken more than 1 lunesta before but it didn't work. She has also rushed a sorority. Has tried trazodone as well.   Denies SI or HI.   Was supposed to graduate in the spring but will end up taking an extra semester.   She has had an issue the past few days with blood with urination. She feels like she has to pee but can't. Never any blood in her underwear. LMP was during Christmas. Did have some burning with urination. Last sexually active about 2 days ago. She does pee after. Denies pain with sex. Planning to restart OCP soon.   Has a little knot on her back. First noticed in October. Not painful. Mobile under skin.   PHQ-SADS Last 3 Score only 07/15/2021 04/07/2021 02/11/2021  PHQ-15 Score 13 9 13   Total GAD-7 Score 9 3 9   PHQ Adolescent Score 9 3 8       Patient's last menstrual period was 06/27/2021.   Patient Active Problem List   Diagnosis Date Noted   Elevated blood-pressure reading without diagnosis of hypertension 04/08/2020   Gastroesophageal reflux disease 01/07/2020   Anorexia nervosa, binge eating/purging type 10/15/2019   Slow transit constipation 10/15/2019   Adjustment disorder with mixed anxiety and depressed mood 10/15/2019   Sleep disturbance 10/15/2019   Chronic nonintractable headache 10/15/2019   Attention deficit hyperactivity disorder (ADHD), combined type 10/15/2019    Current Outpatient Medications on File Prior to Visit  Medication Sig Dispense Refill   amphetamine-dextroamphetamine (ADDERALL) 15 MG tablet Take 1 tablet by  mouth daily. 30 tablet 0   hydrOXYzine (ATARAX/VISTARIL) 25 MG tablet Take 1 tablet (25 mg total) by mouth 3 (three) times daily as needed. 30 tablet 1   lisdexamfetamine (VYVANSE) 60 MG capsule Take 1 capsule (60 mg total) by mouth every morning. 30 capsule 0   pantoprazole (PROTONIX) 40 MG tablet Take 1 tablet (40 mg total) by mouth daily. 30 tablet 3   Vitamin D, Ergocalciferol, (DRISDOL) 1.25 MG (50000 UNIT) CAPS capsule Take 1 capsule (50,000 Units total) by mouth every 7 (seven) days. 8 capsule 0   No current facility-administered medications on file prior to visit.    Allergies  Allergen Reactions   Mango Flavor    Red Dye    Shellfish Allergy     Physical Exam:    Vitals:   07/12/21 1022  BP: 111/74  Pulse: 70  Weight: 171 lb 3.2 oz (77.7 kg)    Growth percentile SmartLinks can only be used for patients less than 22 years old.  Physical Exam Vitals and nursing note reviewed.  Constitutional:      General: She is not in acute distress.    Appearance: She is well-developed.  Neck:     Thyroid: No thyromegaly.  Cardiovascular:     Rate and Rhythm: Normal rate and regular rhythm.     Heart sounds: No murmur heard. Pulmonary:     Breath sounds: Normal breath sounds.  Abdominal:     Palpations: Abdomen  is soft. There is no mass.     Tenderness: There is no abdominal tenderness. There is no guarding.  Musculoskeletal:       Back:     Right lower leg: No edema.     Left lower leg: No edema.     Comments: 4x4 cm mobile, non-tender mass   Lymphadenopathy:     Cervical: No cervical adenopathy.  Skin:    General: Skin is warm.     Findings: No rash.  Neurological:     Mental Status: She is alert.     Comments: No tremor    Assessment/Plan: 1. Adjustment disorder with mixed anxiety and depressed mood Will change buspar and increase- discussed if she is not remembering to take TID she could take 15 mg QAM.  - busPIRone (BUSPAR) 7.5 MG tablet; Take 1 tablet (7.5  mg total) by mouth 3 (three) times daily.  Dispense: 90 tablet; Refill: 3  2. Mass of subcutaneous tissue of back Will get ultrasound of palpable mass- suspect cyst. Can refer for surgery consult if needed.  - [place] CHEST SOFT TISSUE  3. Sleep disturbance Has improved the last two days, will monitor. Psychiatrist was rx for lunesta but reportedly not working.   4. Anorexia nervosa, binge eating/purging type Stable for her.   5. Attention deficit hyperactivity disorder (ADHD), combined type Continue vyvanse and adderall.    Return in 4-6 weeks   Jonathon Resides, FNP

## 2021-07-13 ENCOUNTER — Other Ambulatory Visit: Payer: Self-pay | Admitting: Pediatrics

## 2021-07-13 DIAGNOSIS — R222 Localized swelling, mass and lump, trunk: Secondary | ICD-10-CM

## 2021-07-13 LAB — URINE CYTOLOGY ANCILLARY ONLY
Chlamydia: NEGATIVE
Comment: NEGATIVE
Comment: NEGATIVE
Comment: NORMAL
Neisseria Gonorrhea: NEGATIVE
Trichomonas: NEGATIVE

## 2021-07-16 ENCOUNTER — Encounter: Payer: Self-pay | Admitting: Family

## 2021-07-16 ENCOUNTER — Other Ambulatory Visit: Payer: Self-pay | Admitting: Family

## 2021-07-16 MED ORDER — ELLA 30 MG PO TABS: 1.0000 | ORAL_TABLET | Freq: Once | ORAL | 0 refills | Status: AC

## 2021-07-18 ENCOUNTER — Other Ambulatory Visit: Payer: Self-pay

## 2021-07-18 ENCOUNTER — Ambulatory Visit (INDEPENDENT_AMBULATORY_CARE_PROVIDER_SITE_OTHER): Payer: BC Managed Care – PPO

## 2021-07-18 DIAGNOSIS — Z7251 High risk heterosexual behavior: Secondary | ICD-10-CM

## 2021-07-18 DIAGNOSIS — R222 Localized swelling, mass and lump, trunk: Secondary | ICD-10-CM | POA: Insufficient documentation

## 2021-07-18 MED ORDER — ULIPRISTAL ACETATE 30 MG PO TABS
30.0000 mg | ORAL_TABLET | Freq: Once | ORAL | Status: AC
  Administered 2021-07-18: 30 mg via ORAL

## 2021-07-18 NOTE — Progress Notes (Signed)
Pt here today for emergency contraception. Unprotected sex occurred on 07/15/2021. Follow up appointment set for 08/09/2021. Ella given PO. Tolerated well.

## 2021-07-19 ENCOUNTER — Ambulatory Visit (HOSPITAL_COMMUNITY)
Admission: RE | Admit: 2021-07-19 | Discharge: 2021-07-19 | Disposition: A | Discharge status: Home / Self Care | Payer: BC Managed Care – PPO | Source: Ambulatory Visit | Attending: Pediatrics | Admitting: Pediatrics

## 2021-07-19 DIAGNOSIS — R222 Localized swelling, mass and lump, trunk: Secondary | ICD-10-CM | POA: Insufficient documentation

## 2021-07-20 ENCOUNTER — Encounter: Payer: Self-pay | Admitting: Family

## 2021-07-20 ENCOUNTER — Other Ambulatory Visit: Payer: Self-pay | Admitting: Family

## 2021-07-20 DIAGNOSIS — F902 Attention-deficit hyperactivity disorder, combined type: Secondary | ICD-10-CM

## 2021-07-20 MED ORDER — AMPHETAMINE-DEXTROAMPHETAMINE 15 MG PO TABS: 15.0000 mg | ORAL_TABLET | Freq: Every day | ORAL | 0 refills | Status: DC

## 2021-07-20 MED ORDER — HYDROXYZINE HCL 25 MG PO TABS: ORAL_TABLET | ORAL | 0 refills | Status: DC

## 2021-07-20 MED ORDER — LISDEXAMFETAMINE DIMESYLATE 60 MG PO CAPS: 60.0000 mg | ORAL_CAPSULE | ORAL | 0 refills | Status: DC

## 2021-08-03 ENCOUNTER — Telehealth: Payer: BC Managed Care – PPO | Admitting: Registered"

## 2021-08-09 ENCOUNTER — Ambulatory Visit: Payer: BC Managed Care – PPO | Admitting: Pediatrics

## 2021-08-16 ENCOUNTER — Ambulatory Visit: Payer: BC Managed Care – PPO | Admitting: Pediatrics

## 2021-09-07 ENCOUNTER — Other Ambulatory Visit: Payer: Self-pay | Admitting: Family

## 2021-09-07 DIAGNOSIS — F902 Attention-deficit hyperactivity disorder, combined type: Secondary | ICD-10-CM

## 2021-09-08 ENCOUNTER — Other Ambulatory Visit: Payer: Self-pay | Admitting: Family

## 2021-09-08 MED ORDER — LISDEXAMFETAMINE DIMESYLATE 60 MG PO CAPS: 60.0000 mg | ORAL_CAPSULE | ORAL | 0 refills | Status: DC

## 2021-09-08 MED ORDER — AMPHETAMINE-DEXTROAMPHETAMINE 15 MG PO TABS: 15.0000 mg | ORAL_TABLET | Freq: Every day | ORAL | 0 refills | Status: DC

## 2021-09-08 MED ORDER — HYDROXYZINE HCL 25 MG PO TABS: ORAL_TABLET | ORAL | 0 refills | Status: DC

## 2021-09-19 ENCOUNTER — Other Ambulatory Visit: Payer: Self-pay | Admitting: Pediatrics

## 2021-09-19 MED ORDER — FLUCONAZOLE 150 MG PO TABS: ORAL_TABLET | ORAL | 0 refills | Status: AC

## 2021-09-19 MED ORDER — METRONIDAZOLE 500 MG PO TABS: 500.0000 mg | ORAL_TABLET | Freq: Two times a day (BID) | ORAL | 0 refills | Status: AC

## 2021-10-11 ENCOUNTER — Other Ambulatory Visit: Payer: Self-pay | Admitting: Family

## 2021-10-11 DIAGNOSIS — F902 Attention-deficit hyperactivity disorder, combined type: Secondary | ICD-10-CM

## 2021-10-12 MED ORDER — LISDEXAMFETAMINE DIMESYLATE 60 MG PO CAPS: 60.0000 mg | ORAL_CAPSULE | ORAL | 0 refills | Status: DC

## 2021-10-12 MED ORDER — HYDROXYZINE HCL 25 MG PO TABS: ORAL_TABLET | ORAL | 0 refills | Status: DC

## 2021-10-12 MED ORDER — AMPHETAMINE-DEXTROAMPHETAMINE 15 MG PO TABS: 15.0000 mg | ORAL_TABLET | Freq: Every day | ORAL | 0 refills | Status: DC

## 2021-11-08 ENCOUNTER — Other Ambulatory Visit: Payer: Self-pay | Admitting: Family

## 2021-11-08 DIAGNOSIS — F902 Attention-deficit hyperactivity disorder, combined type: Secondary | ICD-10-CM

## 2021-11-08 MED ORDER — LISDEXAMFETAMINE DIMESYLATE 60 MG PO CAPS: 60.0000 mg | ORAL_CAPSULE | ORAL | 0 refills | Status: DC

## 2021-11-08 MED ORDER — AMPHETAMINE-DEXTROAMPHETAMINE 15 MG PO TABS: 15.0000 mg | ORAL_TABLET | Freq: Every day | ORAL | 0 refills | Status: DC

## 2021-11-08 MED ORDER — HYDROXYZINE HCL 25 MG PO TABS: ORAL_TABLET | ORAL | 0 refills | Status: DC

## 2021-12-17 ENCOUNTER — Other Ambulatory Visit: Payer: Self-pay | Admitting: Family

## 2021-12-17 DIAGNOSIS — F902 Attention-deficit hyperactivity disorder, combined type: Secondary | ICD-10-CM

## 2021-12-18 MED ORDER — LISDEXAMFETAMINE DIMESYLATE 60 MG PO CAPS: 60.0000 mg | ORAL_CAPSULE | ORAL | 0 refills | Status: DC

## 2021-12-18 MED ORDER — AMPHETAMINE-DEXTROAMPHETAMINE 15 MG PO TABS: 15.0000 mg | ORAL_TABLET | Freq: Every day | ORAL | 0 refills | Status: DC

## 2021-12-18 MED ORDER — VITAMIN D (ERGOCALCIFEROL) 1.25 MG (50000 UNIT) PO CAPS
50000.0000 [IU] | ORAL_CAPSULE | ORAL | 0 refills | Status: AC
Start: 2021-12-18 — End: ?

## 2021-12-18 MED ORDER — HYDROXYZINE HCL 25 MG PO TABS: ORAL_TABLET | ORAL | 0 refills | Status: DC

## 2021-12-20 ENCOUNTER — Other Ambulatory Visit: Payer: Self-pay | Admitting: Pediatrics

## 2021-12-20 DIAGNOSIS — K219 Gastro-esophageal reflux disease without esophagitis: Secondary | ICD-10-CM

## 2021-12-20 MED ORDER — LEVONORGESTREL-ETHINYL ESTRAD 0.1-20 MG-MCG PO TABS: 1.0000 | ORAL_TABLET | Freq: Every day | ORAL | 3 refills | Status: AC

## 2021-12-20 MED ORDER — PANTOPRAZOLE SODIUM 40 MG PO TBEC: 40.0000 mg | DELAYED_RELEASE_TABLET | Freq: Every day | ORAL | 3 refills | Status: AC

## 2022-01-19 ENCOUNTER — Other Ambulatory Visit: Payer: Self-pay | Admitting: Family

## 2022-01-19 DIAGNOSIS — F902 Attention-deficit hyperactivity disorder, combined type: Secondary | ICD-10-CM

## 2022-01-19 MED ORDER — AMPHETAMINE-DEXTROAMPHETAMINE 15 MG PO TABS: 15.0000 mg | ORAL_TABLET | Freq: Every day | ORAL | 0 refills | Status: DC

## 2022-01-19 MED ORDER — LISDEXAMFETAMINE DIMESYLATE 60 MG PO CAPS: 60.0000 mg | ORAL_CAPSULE | ORAL | 0 refills | Status: DC

## 2022-02-07 ENCOUNTER — Other Ambulatory Visit: Payer: Self-pay | Admitting: Family

## 2022-02-18 ENCOUNTER — Other Ambulatory Visit: Payer: Self-pay | Admitting: Family

## 2022-02-18 DIAGNOSIS — F902 Attention-deficit hyperactivity disorder, combined type: Secondary | ICD-10-CM

## 2022-02-18 MED ORDER — AMPHETAMINE-DEXTROAMPHETAMINE 15 MG PO TABS: 15.0000 mg | ORAL_TABLET | Freq: Every day | ORAL | 0 refills | Status: DC

## 2022-02-18 MED ORDER — LISDEXAMFETAMINE DIMESYLATE 60 MG PO CAPS: 60.0000 mg | ORAL_CAPSULE | ORAL | 0 refills | Status: DC

## 2022-03-21 ENCOUNTER — Ambulatory Visit: Payer: BC Managed Care – PPO | Admitting: Registered"

## 2022-03-21 ENCOUNTER — Encounter: Payer: Self-pay | Admitting: Registered"

## 2022-03-21 ENCOUNTER — Encounter: Payer: BC Managed Care – PPO | Attending: Family | Admitting: Registered"

## 2022-03-21 DIAGNOSIS — Z713 Dietary counseling and surveillance: Secondary | ICD-10-CM | POA: Diagnosis present

## 2022-03-21 NOTE — Progress Notes (Signed)
This visit was completed virtually due to the COVID-19 pandemic.   I spoke with 03/21/2022 and verified that I was speaking with the correct person with two patient identifiers (full name and date of birth).   I discussed the limitations related to this kind of visit and the patient is willing to proceed.   Appointment start time: 3:15 Appointment end time: 3:53  Patient was seen on 9/19//2023 for nutrition counseling pertaining to disordered eating  Primary care provider: Harrison Mons, PA-C Therapist: Craige Cotta (sees weekly; virtually)  ROI: 03/02/2020 Any other medical team members: adolescent medicine Parents: none   Assessment  Arrives stating things are going well. States she still has same therapist and will be starting with a new PCP soon. States she was purging during the summer and got into a habit for about 1-2 weeks and reduced episodes once realizing there were mold in the bathroom. States since then she has only purged 2-3 times.   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    Eating history: Length of time: 7 years, since age 49 Previous treatments: no Goals for RD meetings: improve constipation, dizziness/lightheadedness, headaches  Weight history:  Highest weight:    Lowest weight:  Most consistent weight:   What would you like to weigh: N/A How has weight changed in the past year: up and down  Medical Information:  Changes in hair, skin, nails since ED started: hair shedding, thin nails, easily bruises Chewing/swallowing difficulties: no Reflux or heartburn: sometimes, taking meds as needed Trouble with teeth: no  LMP without the use of hormones: 9/12 Effect of exercise on menses: no   Constipation, diarrhea: yes, constipated has BM every day or every other day;  taking Miralax daily Dizziness/lightheadedness: sometimes Headaches/body aches: yes, often, every other day Heart racing/chest pain: sometimes Mood: fine Sleep: sleeps 6-9  hrs/night;  Focus/concentration: yes, challenges. Has ADHD Cold intolerance: sometimes Vision changes: none   Mental health diagnosis: AN, binge purge type   Dietary assessment: A typical day consists of 1-2 meals and 3-4 snacks  Safe foods include: pickles, strawberries, grilled chicken wrap, protein bar, carrots, cucumbers, almond thins, [place], apples, almond milk yogurt, cheerios, toast  Avoided foods include: all other items  24 hour recall:  B: yogurt + apple  S: handful of gummies L: asleep until 8:30 pm; typically skips S:  D (8:30 pm): [place]- 1/2 burrito (chicken, vegetables) + rice + 1/2 basket chips + salsa + water S: Beverages: water (2.5*20 oz; 50 oz), Gatorade (2 oz); 52 oz  Physical activity: sometimes; walks, hula hoops   What Methods Do You Use To Control Your Weight (Compensatory behaviors)?           Restricting   SIV  Exercise - hula hoop  Food rules or rituals - doesn't want to talk about it  Binge - not anymore  Estimated energy intake: 1100-1200 kcal  Estimated energy needs: 2000-2200 kcal 250-275 g CHO 125-138 g pro 56-61 g fat  Nutrition Diagnosis: NB-1.5 Disordered eating pattern As related to harmful beliefs about eating.  As evidenced by restriction of food.  Intervention/Goals: Pt was educated and counseled on eating to nourish the body and ways to increase nourishment. Discussed ways to have breakfast. Pt was in agreement with goals listed. Goals: - Aim to add granola to breakfast.  - Aim to have at least 3 of 5 food groups with breakfast.  - Aim to drink at least 2 bottles of water bottle (40 oz each).  Meal plan:    1-2 meals    1-2 snacks  Monitoring and Evaluation: Patient will follow up in 4 weeks.

## 2022-03-21 NOTE — Patient Instructions (Addendum)
-   Aim to add granola to breakfast.   - Aim to have at least 3 of 5 food groups with breakfast.   - Aim to drink at least 2 bottles of water bottle (40 oz each).

## 2022-03-27 ENCOUNTER — Encounter: Payer: Self-pay | Admitting: Family

## 2022-03-27 ENCOUNTER — Other Ambulatory Visit: Payer: Self-pay | Admitting: Family

## 2022-03-27 DIAGNOSIS — F902 Attention-deficit hyperactivity disorder, combined type: Secondary | ICD-10-CM

## 2022-03-29 ENCOUNTER — Other Ambulatory Visit: Payer: Self-pay | Admitting: Family

## 2022-03-29 DIAGNOSIS — F902 Attention-deficit hyperactivity disorder, combined type: Secondary | ICD-10-CM

## 2022-03-29 DIAGNOSIS — F4323 Adjustment disorder with mixed anxiety and depressed mood: Secondary | ICD-10-CM

## 2022-03-29 MED ORDER — AMPHETAMINE-DEXTROAMPHETAMINE 15 MG PO TABS: 15.0000 mg | ORAL_TABLET | Freq: Every day | ORAL | 0 refills | Status: AC

## 2022-03-29 MED ORDER — BUSPIRONE HCL 7.5 MG PO TABS
7.5000 mg | ORAL_TABLET | Freq: Three times a day (TID) | ORAL | 3 refills | Status: DC
Start: 2022-03-29 — End: 2022-03-29

## 2022-03-29 MED ORDER — HYDROXYZINE HCL 25 MG PO TABS: ORAL_TABLET | ORAL | 0 refills | Status: DC

## 2022-03-29 MED ORDER — AMPHETAMINE-DEXTROAMPHETAMINE 15 MG PO TABS: 15.0000 mg | ORAL_TABLET | Freq: Every day | ORAL | 0 refills | Status: DC

## 2022-03-29 MED ORDER — HYDROXYZINE HCL 25 MG PO TABS: ORAL_TABLET | ORAL | 0 refills | Status: AC

## 2022-03-29 MED ORDER — BUSPIRONE HCL 7.5 MG PO TABS: 7.5000 mg | ORAL_TABLET | Freq: Three times a day (TID) | ORAL | 3 refills | Status: AC

## 2022-03-29 MED ORDER — LISDEXAMFETAMINE DIMESYLATE 60 MG PO CAPS: 60.0000 mg | ORAL_CAPSULE | ORAL | 0 refills | Status: AC

## 2022-04-26 ENCOUNTER — Encounter: Payer: BC Managed Care – PPO | Attending: Family | Admitting: Registered"

## 2022-04-26 ENCOUNTER — Encounter: Payer: Self-pay | Admitting: Registered"

## 2022-04-26 DIAGNOSIS — Z713 Dietary counseling and surveillance: Secondary | ICD-10-CM | POA: Insufficient documentation

## 2022-04-26 NOTE — Progress Notes (Signed)
This visit was completed virtually due to the COVID-19 pandemic.   I spoke with 03/21/2022 and verified that I was speaking with the correct person with two patient identifiers (full name and date of birth).   I discussed the limitations related to this kind of visit and the patient is willing to proceed.   Appointment start time: 3:03 Appointment end time: 3:21  Patient was seen on 9/19//2023 for nutrition counseling pertaining to disordered eating  Primary care provider: Harrison Mons, PA-C Therapist: Craige Cotta (sees weekly; virtually)  ROI: 03/02/2020 Any other medical team members: adolescent medicine Parents: none   Assessment  States she saw PA-C for the first time today; had labs drawn. States she was able to have 3 food options for breakfast sometimes but not often. States she feels like she has no appetite nowadays. States would force herself to eat 3 items per meal even if she wasn't hungry. Reports she has been feeling nauseous recently and feels like she is getting sick. Reports she was only able to eat 5 of 8 grilled nuggets and a few french fries today. Pt wanted to end appt early today due to feeling exhausted.   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    Eating history: Length of time: 7 years, since age 67 Previous treatments: no Goals for RD meetings: improve constipation, dizziness/lightheadedness, headaches  Weight history:  Highest weight:    Lowest weight:  Most consistent weight:   What would you like to weigh: N/A How has weight changed in the past year: up and down  Medical Information:  Changes in hair, skin, nails since ED started: hair shedding, thin nails, easily bruises Chewing/swallowing difficulties: no Reflux or heartburn: sometimes, taking meds as needed Trouble with teeth: no  LMP without the use of hormones: 9/12 Effect of exercise on menses: no   Constipation, diarrhea: yes, constipated has BM every day or every other day;   taking Miralax daily Dizziness/lightheadedness: sometimes Headaches/body aches: yes, often, every other day Heart racing/chest pain: sometimes Mood: fine Sleep: sleeps 6-9 hrs/night;  Focus/concentration: yes, challenges. Has ADHD Cold intolerance: sometimes Vision changes: none   Mental health diagnosis: AN, binge purge type   Dietary assessment: A typical day consists of 1-2 meals and 3-4 snacks  Safe foods include: pickles, strawberries, grilled chicken wrap, protein bar, carrots, cucumbers, almond thins, [place], apples, almond milk yogurt, cheerios, toast  Avoided foods include: all other items  24 hour recall:  B (6:30 am): apple  S:  L (1 pm): cheese stick + 3 dark chocolate M&Ms S:  D (8:30 pm): 1/2 piece of grilled chicken + 1/2 c spinach pesto pasta  S: Beverages: water (2.5*20 oz; 40 oz), Gatorade (2 oz); 52 oz  Physical activity: sometimes; walks, hula hoops   What Methods Do You Use To Control Your Weight (Compensatory behaviors)?           Restricting   SIV  Exercise - hula hoop  Food rules or rituals - doesn't want to talk about it  Binge - not anymore  Estimated energy intake: 700-800 kcal  Estimated energy needs: 2000-2200 kcal 250-275 g CHO 125-138 g pro 56-61 g fat  Nutrition Diagnosis: NB-1.5 Disordered eating pattern As related to harmful beliefs about eating.  As evidenced by restriction of food.  Intervention/Goals: Pt was encouraged to nourish body while working through not feeling well and ways to do that. Reminded of previous goals related to nourishing body for breakfast. Pt was in  agreement with goals listed. Goals: - Try to have soup + crackers + juice for dinner tonight.  - Continue to have meal in the morning: PB toast + strawberries or yogurt + fruit + cereal.  Meal plan:    1-2 meals    1-2 snacks  Monitoring and Evaluation: Patient will follow up in 5 weeks.

## 2022-04-26 NOTE — Patient Instructions (Addendum)
-   Try to have soup + crackers + juice for dinner tonight.   - Continue to have meal in the morning: PB toast + strawberries or yogurt + fruit + cereal.

## 2022-05-27 IMAGING — US US SOFT TISSUE
1 series · 14 of 16 positions shown · non-contrast
Comparison: None.

CLINICAL DATA: Patient noticed lump [DATE] within posterior left
back thoracic area. Suspected 4 x 2 cm cyst. No change in size per
the patient.

EXAM:
ULTRASOUND OF CHEST SOFT TISSUES
TECHNIQUE: Ultrasound examination of the chest wall soft tissues was performed
in the area of clinical concern.

[Series 1: us chest soft tissue · 17 acquisitions, 14 frames shown]
[im 1/17]
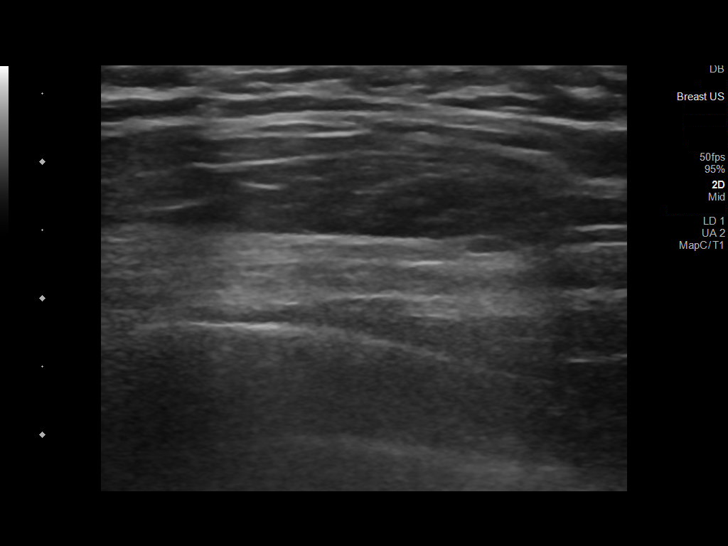
[im 2/17]
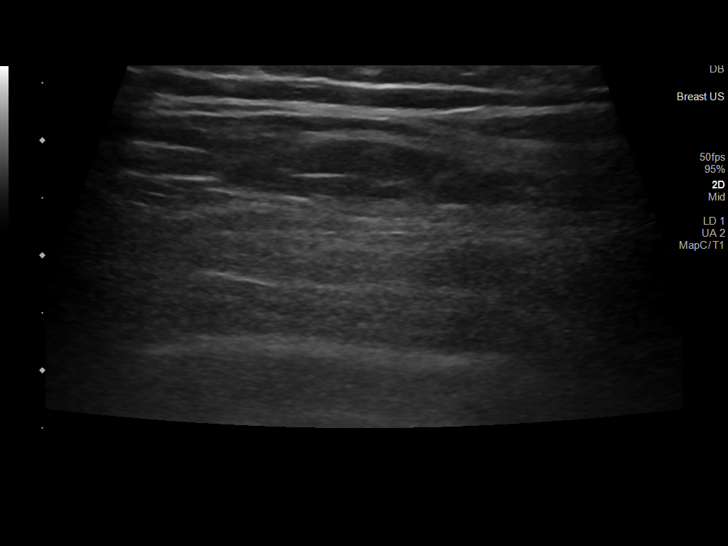
[im 3/17]
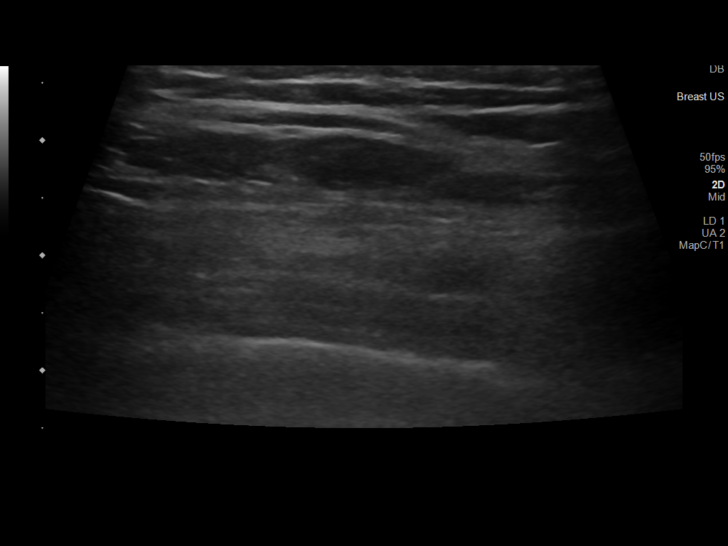
[im 5/17]
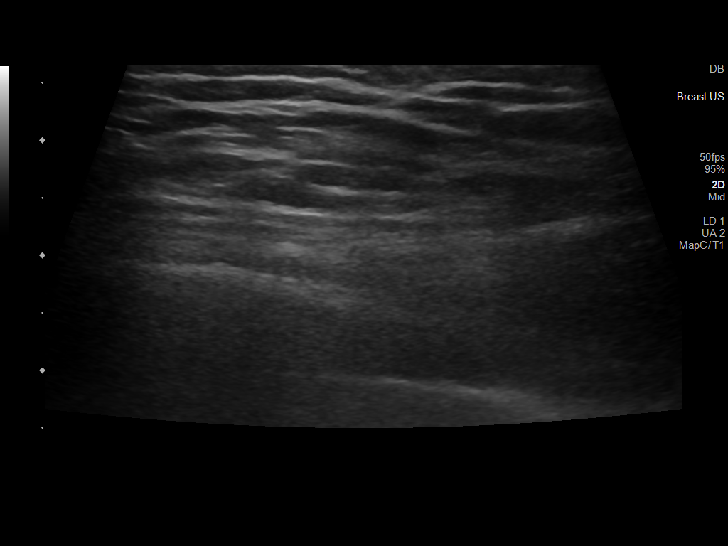
[im 6/17]
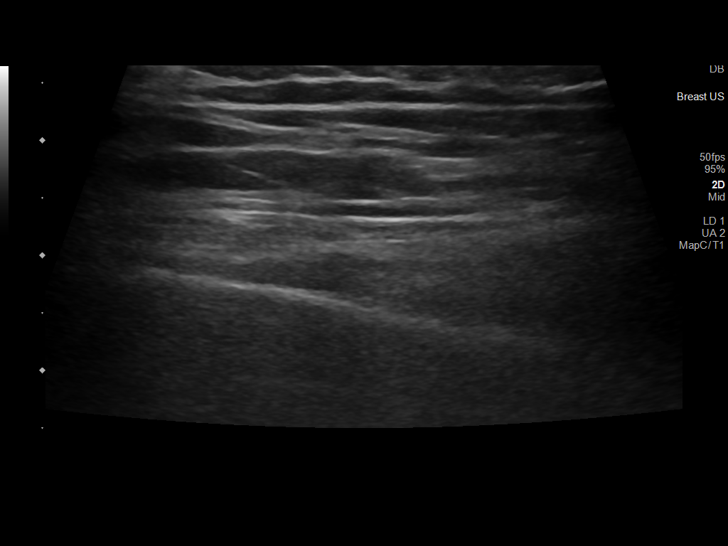
[im 7/17]
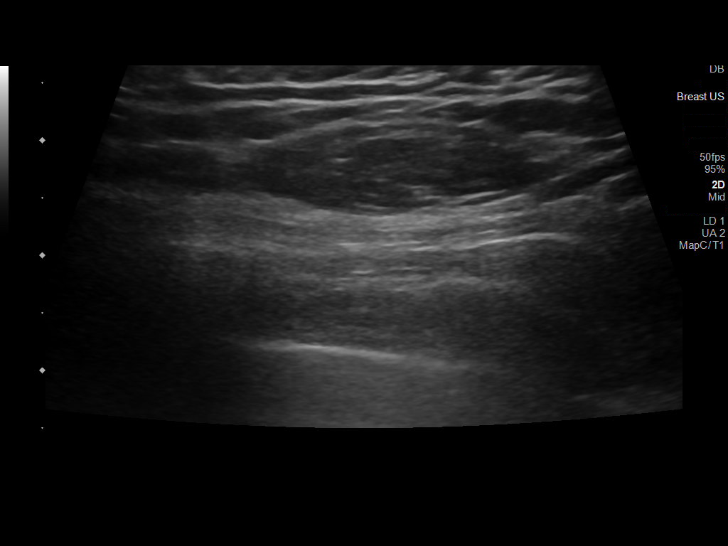
[im 8/17]
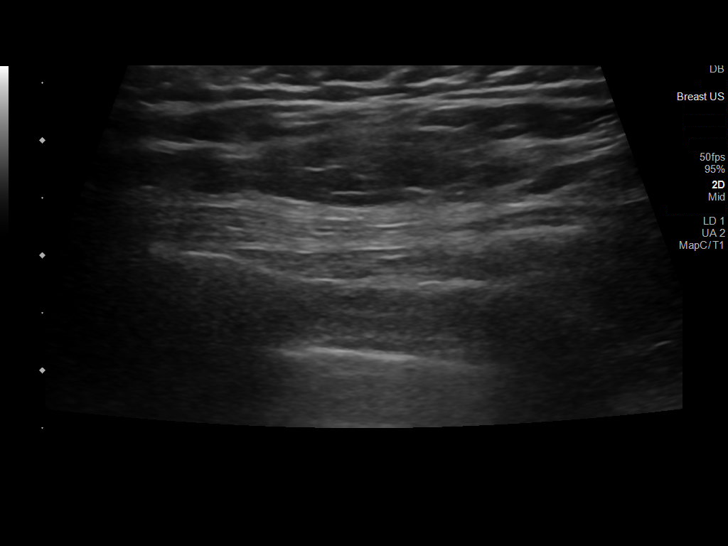
[im 9/17]
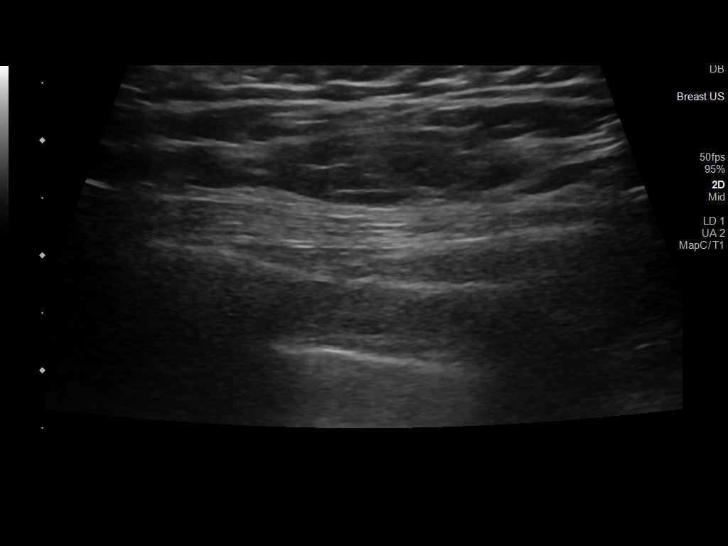
[im 10/17]
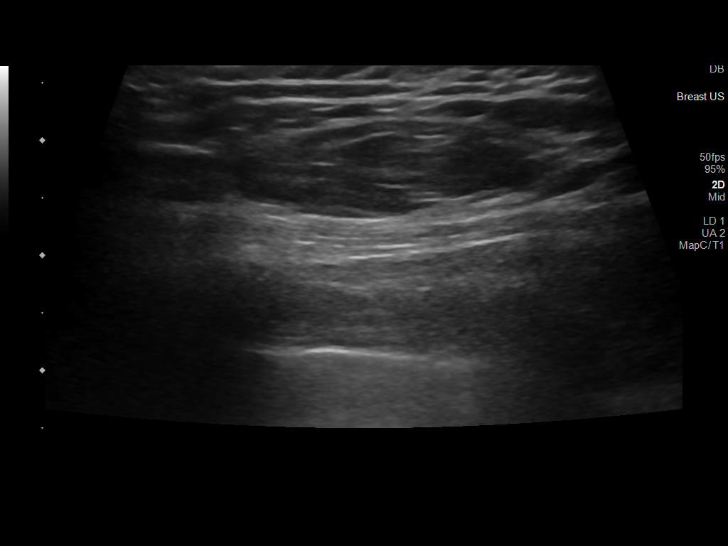
[im 11/17]
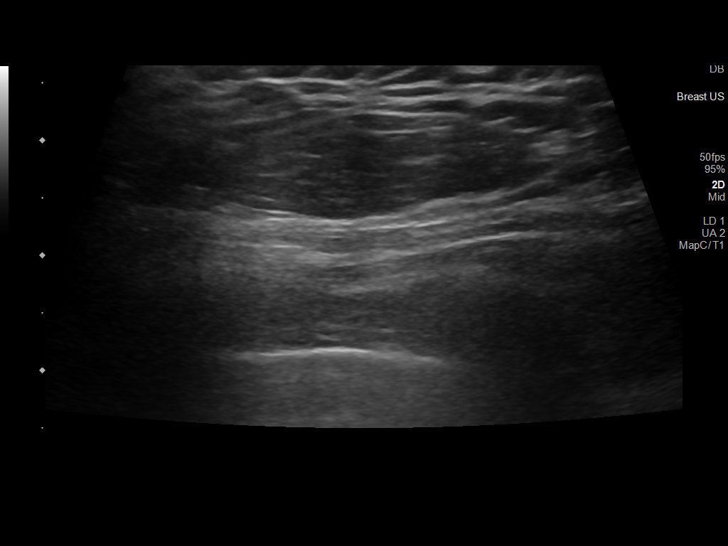
[im 13/17]
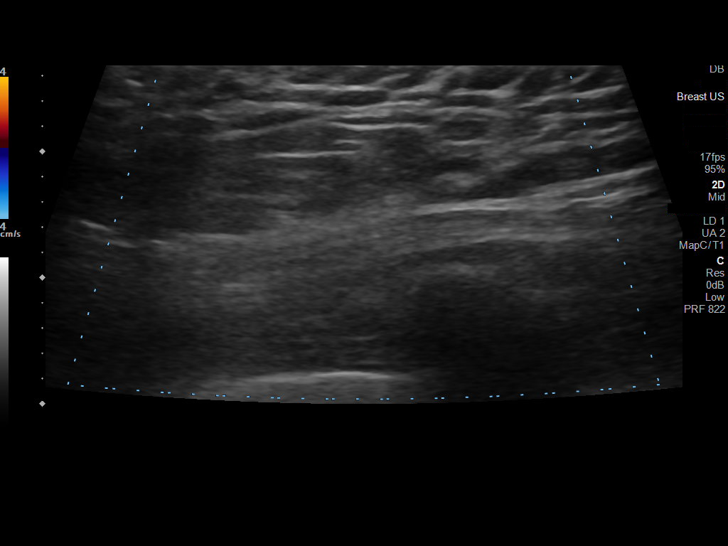
[im 14/17]
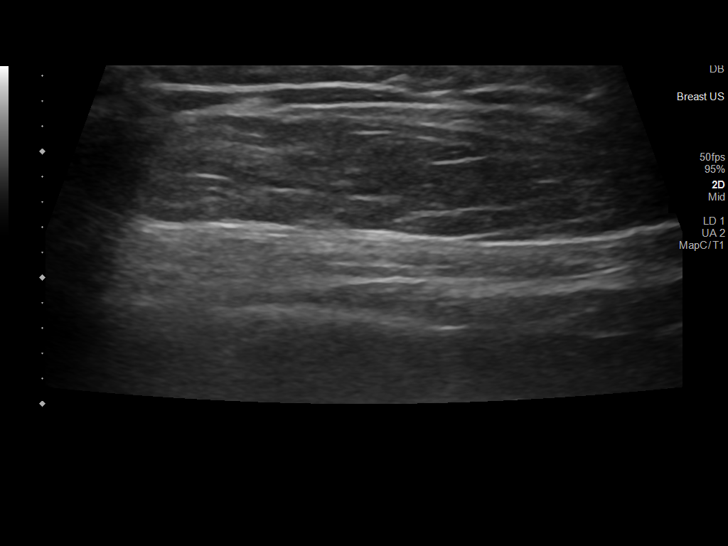
[im 15/17]
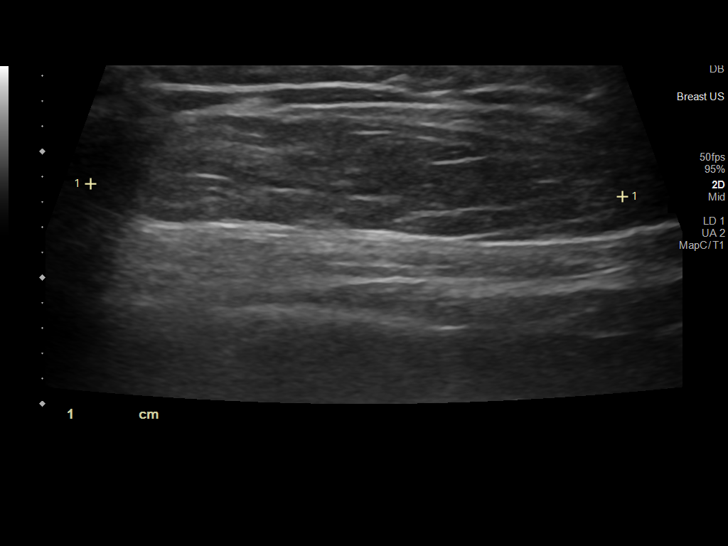
[im 17/17]
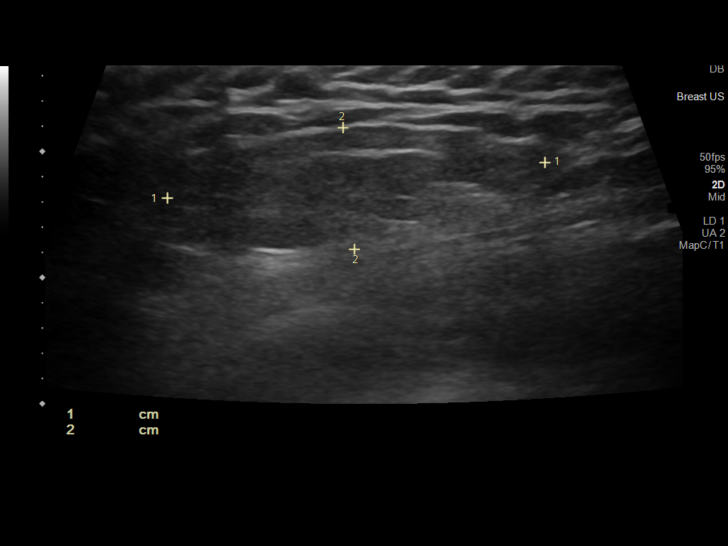

[14 of 16 positions shown; findings below may reference images not displayed]

FINDINGS: An area of interest of the posterior left back soft tissues, there
is a well-circumscribed, hypoechoic structure measuring
approximately 3.0 x 1.0 x 4.2 cm. This is nonspecific but appears to
be isoechoic with surrounding subcutaneous fat and may represent a
lipoma. No fluid cyst is seen.
IMPRESSION: The patient's palpable abnormality within the posterior left back
corresponds to a hypoechoic nodule that is isoechoic to fat. This is
nonspecific but may represent a lipoma.

## 2022-05-31 ENCOUNTER — Ambulatory Visit: Payer: BC Managed Care – PPO | Admitting: Registered"

## 2022-05-31 ENCOUNTER — Encounter: Payer: BC Managed Care – PPO | Attending: Family | Admitting: Registered"

## 2022-07-05 ENCOUNTER — Encounter: Payer: BC Managed Care – PPO | Attending: Family | Admitting: Registered"

## 2022-07-05 DIAGNOSIS — Z713 Dietary counseling and surveillance: Secondary | ICD-10-CM | POA: Insufficient documentation
